# Patient Record
Sex: Female | Born: 1995 | Race: White | Hispanic: No | Marital: Married | Smoking: Never smoker
Health system: Southern US, Community
[De-identification: ages and names within clinical notes are randomized; demographics above are authoritative.]

## PROBLEM LIST (undated history)

## (undated) DIAGNOSIS — K219 Gastro-esophageal reflux disease without esophagitis: Secondary | ICD-10-CM

## (undated) DIAGNOSIS — Z309 Encounter for contraceptive management, unspecified: Secondary | ICD-10-CM

## (undated) HISTORY — PX: NO PAST SURGERIES: SHX2092

## (undated) HISTORY — DX: Gastro-esophageal reflux disease without esophagitis: K21.9

## (undated) HISTORY — DX: Encounter for contraceptive management, unspecified: Z30.9

---

## 2019-02-17 ENCOUNTER — Ambulatory Visit (INDEPENDENT_AMBULATORY_CARE_PROVIDER_SITE_OTHER): Payer: Managed Care, Other (non HMO) | Admitting: Family Medicine

## 2019-02-17 ENCOUNTER — Encounter: Payer: Self-pay | Admitting: Family Medicine

## 2019-02-17 ENCOUNTER — Other Ambulatory Visit: Payer: Self-pay

## 2019-02-17 VITALS — BP 100/43 | HR 72 | Ht 65.0 in | Wt 106.0 lb

## 2019-02-17 DIAGNOSIS — Z3201 Encounter for pregnancy test, result positive: Secondary | ICD-10-CM | POA: Diagnosis not present

## 2019-02-17 DIAGNOSIS — Z124 Encounter for screening for malignant neoplasm of cervix: Secondary | ICD-10-CM

## 2019-02-17 DIAGNOSIS — Z01419 Encounter for gynecological examination (general) (routine) without abnormal findings: Secondary | ICD-10-CM | POA: Diagnosis not present

## 2019-02-17 DIAGNOSIS — N912 Amenorrhea, unspecified: Secondary | ICD-10-CM

## 2019-02-17 DIAGNOSIS — Z113 Encounter for screening for infections with a predominantly sexual mode of transmission: Secondary | ICD-10-CM | POA: Diagnosis not present

## 2019-02-17 LAB — POCT URINE PREGNANCY: Preg Test, Ur: POSITIVE — AB

## 2019-02-17 MED ORDER — ONDANSETRON 4 MG PO TBDP: 4.0000 mg | ORAL_TABLET | Freq: Four times a day (QID) | ORAL | 6 refills | Status: DC | PRN

## 2019-02-17 MED ORDER — FAMOTIDINE 20 MG PO TABS: 20.0000 mg | ORAL_TABLET | Freq: Two times a day (BID) | ORAL | 3 refills | Status: DC

## 2019-02-17 NOTE — Progress Notes (Signed)
Patient has had irregular periods. Has gone 3 months without period. Kathrene Alu RN

## 2019-02-17 NOTE — Progress Notes (Signed)
GYNECOLOGY ANNUAL PREVENTATIVE CARE ENCOUNTER NOTE  Subjective:   Christina Reeves is a 23 y.o. No obstetric history on file. female here for a routine annual gynecologic exam.  Current complaints: Irregular periods. LMP 10/29/2018. Has history of irregular periods and has gone 6 months without periods. Is nauseated and has heartburn.   Denies abnormal vaginal bleeding, discharge, pelvic pain, problems with intercourse or other gynecologic concerns.    Gynecologic History Patient's last menstrual period was 10/29/2018 (exact date). Patient is sexually active  Contraception: none Last Pap: 2017. Results were: normal Last mammogram: n/a.  Obstetric History OB History  No obstetric history on file.    History reviewed. No pertinent past medical history.  History reviewed. No pertinent surgical history.  Current Outpatient Medications on File Prior to Visit  Medication Sig Dispense Refill  . omeprazole (PRILOSEC) 10 MG capsule Take 10 mg by mouth daily.    . ondansetron (ZOFRAN) 4 MG tablet Take by mouth.     No current facility-administered medications on file prior to visit.     Not on File  Social History   Socioeconomic History  . Marital status: Single    Spouse name: Not on file  . Number of children: Not on file  . Years of education: Not on file  . Highest education level: Not on file  Occupational History  . Not on file  Social Needs  . Financial resource strain: Not on file  . Food insecurity    Worry: Not on file    Inability: Not on file  . Transportation needs    Medical: Not on file    Non-medical: Not on file  Tobacco Use  . Smoking status: Never Smoker  . Smokeless tobacco: Never Used  Substance and Sexual Activity  . Alcohol use: Yes    Frequency: Never    Comment: on weekends  . Drug use: Never  . Sexual activity: Yes  Lifestyle  . Physical activity    Days per week: Not on file    Minutes per session: Not on file  . Stress: Not on file   Relationships  . Social Herbalist on phone: Not on file    Gets together: Not on file    Attends religious service: Not on file    Active member of club or organization: Not on file    Attends meetings of clubs or organizations: Not on file    Relationship status: Not on file  . Intimate partner violence    Fear of current or ex partner: Not on file    Emotionally abused: Not on file    Physically abused: Not on file    Forced sexual activity: Not on file  Other Topics Concern  . Not on file  Social History Narrative  . Not on file    Family History  Problem Relation Age of Onset  . Cancer Paternal Grandfather   . Cancer Paternal Grandmother        breast  . Breast cancer Paternal Grandmother   . Hypertension Mother   . Diabetes Brother     The following portions of the patient's history were reviewed and updated as appropriate: allergies, current medications, past family history, past medical history, past social history, past surgical history and problem list.  Review of Systems Pertinent items are noted in HPI.   Objective:  BP (!) 100/43   Pulse 72   Ht 5\' 5"  (1.651 m)   Wt 106  lb (48.1 kg)   LMP 10/29/2018 (Exact Date)   BMI 17.64 kg/m  Wt Readings from Last 3 Encounters:  02/17/19 106 lb (48.1 kg)     CONSTITUTIONAL: Well-developed, well-nourished female in no acute distress.  HENT:  Normocephalic, atraumatic, External right and left ear normal. Oropharynx is clear and moist EYES: Conjunctivae and EOM are normal. Pupils are equal, round, and reactive to light. No scleral icterus.  NECK: Normal range of motion, supple, no masses.  Normal thyroid.   CARDIOVASCULAR: Normal heart rate noted, regular rhythm RESPIRATORY: Clear to auscultation bilaterally. Effort and breath sounds normal, no problems with respiration noted. BREASTS: Symmetric in size. No masses, skin changes, nipple drainage, or lymphadenopathy. ABDOMEN: Soft, normal bowel sounds, no  distention noted.  No tenderness, rebound or guarding.  PELVIC: Normal appearing external genitalia; normal appearing vaginal mucosa and cervix.  No abnormal discharge noted.  Normal uterine size, no other palpable masses, no uterine or adnexal tenderness. MUSCULOSKELETAL: Normal range of motion. No tenderness.  No cyanosis, clubbing, or edema.  2+ distal pulses. SKIN: Skin is warm and dry. No rash noted. Not diaphoretic. No erythema. No pallor. NEUROLOGIC: Alert and oriented to person, place, and time. Normal reflexes, muscle tone coordination. No cranial nerve deficit noted. PSYCHIATRIC: Normal mood and affect. Normal behavior. Normal judgment and thought content.  Assessment:  Annual gynecologic examination with pap smear   Plan:  1. Well Woman Exam Will follow up results of pap smear and manage accordingly. STD testing discussed. Patient requested testing   2. Amenorrhea + pregnancy test. Patient shocked and excited about results. Will get PNL and get patient scheduled for [place] and initial OB appointment. - POCT urine pregnancy  - Cytology - PAP( [facility])  3. Positive pregnancy test - Obstetric Panel, Including HIV - Urine Culture   Routine preventative health maintenance measures emphasized. Please refer to After Visit Summary for other counseling recommendations.    Candelaria Celeste, DO [facility]

## 2019-02-18 LAB — OBSTETRIC PANEL, INCLUDING HIV
Antibody Screen: NEGATIVE
Basophils Absolute: 0 10*3/uL (ref 0.0–0.2)
Basos: 0 %
EOS (ABSOLUTE): 0 10*3/uL (ref 0.0–0.4)
Eos: 0 %
HIV Screen 4th Generation wRfx: NONREACTIVE
Hematocrit: 31.4 % — ABNORMAL LOW (ref 34.0–46.6)
Hemoglobin: 10.8 g/dL — ABNORMAL LOW (ref 11.1–15.9)
Hepatitis B Surface Ag: NEGATIVE
Immature Grans (Abs): 0 10*3/uL (ref 0.0–0.1)
Immature Granulocytes: 0 %
Lymphocytes Absolute: 1.4 10*3/uL (ref 0.7–3.1)
Lymphs: 17 %
MCH: 26.7 pg (ref 26.6–33.0)
MCHC: 34.4 g/dL (ref 31.5–35.7)
MCV: 78 fL — ABNORMAL LOW (ref 79–97)
Monocytes Absolute: 0.4 10*3/uL (ref 0.1–0.9)
Monocytes: 5 %
Neutrophils Absolute: 6.6 10*3/uL (ref 1.4–7.0)
Neutrophils: 78 %
Platelets: 181 10*3/uL (ref 150–450)
RBC: 4.05 x10E6/uL (ref 3.77–5.28)
RDW: 13 % (ref 11.7–15.4)
RPR Ser Ql: NONREACTIVE
Rh Factor: POSITIVE
Rubella Antibodies, IGG: 3.15 index (ref >0.99)
WBC: 8.4 10*3/uL (ref 3.4–10.8)

## 2019-02-19 LAB — URINE CULTURE: Organism ID, Bacteria: NO GROWTH

## 2019-02-23 LAB — CYTOLOGY - PAP
Chlamydia: NEGATIVE
Comment: NEGATIVE
Comment: NORMAL
Diagnosis: NEGATIVE
Neisseria Gonorrhea: NEGATIVE

## 2019-02-24 ENCOUNTER — Inpatient Hospital Stay (HOSPITAL_COMMUNITY)
Admission: AD | Admit: 2019-02-24 | Discharge: 2019-02-24 | Disposition: A | Discharge status: Home / Self Care | Payer: Managed Care, Other (non HMO) | Attending: Obstetrics & Gynecology | Admitting: Obstetrics & Gynecology

## 2019-02-24 ENCOUNTER — Inpatient Hospital Stay (HOSPITAL_COMMUNITY): Payer: Managed Care, Other (non HMO)

## 2019-02-24 ENCOUNTER — Other Ambulatory Visit: Payer: Self-pay

## 2019-02-24 ENCOUNTER — Encounter (HOSPITAL_COMMUNITY): Payer: Self-pay | Admitting: *Deleted

## 2019-02-24 DIAGNOSIS — O26899 Other specified pregnancy related conditions, unspecified trimester: Secondary | ICD-10-CM

## 2019-02-24 DIAGNOSIS — R109 Unspecified abdominal pain: Secondary | ICD-10-CM | POA: Insufficient documentation

## 2019-02-24 DIAGNOSIS — Z3A13 13 weeks gestation of pregnancy: Secondary | ICD-10-CM

## 2019-02-24 DIAGNOSIS — O26891 Other specified pregnancy related conditions, first trimester: Secondary | ICD-10-CM | POA: Diagnosis not present

## 2019-02-24 LAB — URINALYSIS, ROUTINE W REFLEX MICROSCOPIC
Bilirubin Urine: NEGATIVE
Glucose, UA: NEGATIVE mg/dL
Hgb urine dipstick: NEGATIVE
Ketones, ur: NEGATIVE mg/dL
Leukocytes,Ua: NEGATIVE
Nitrite: NEGATIVE
Protein, ur: NEGATIVE mg/dL
Specific Gravity, Urine: 1.002 — ABNORMAL LOW (ref 1.005–1.030)
pH: 7 (ref 5.0–8.0)

## 2019-02-24 LAB — CBC
HCT: 32.5 % — ABNORMAL LOW (ref 36.0–46.0)
Hemoglobin: 11.3 g/dL — ABNORMAL LOW (ref 12.0–15.0)
MCH: 26.3 pg (ref 26.0–34.0)
MCHC: 34.8 g/dL (ref 30.0–36.0)
MCV: 75.6 fL — ABNORMAL LOW (ref 80.0–100.0)
Platelets: 163 10*3/uL (ref 150–400)
RBC: 4.3 MIL/uL (ref 3.87–5.11)
RDW: 12.7 % (ref 11.5–15.5)
WBC: 7.6 10*3/uL (ref 4.0–10.5)
nRBC: 0 % (ref 0.0–0.2)

## 2019-02-24 LAB — HCG, QUANTITATIVE, PREGNANCY: hCG, Beta Chain, Quant, S: 57791 m[IU]/mL — ABNORMAL HIGH (ref <5)

## 2019-02-24 NOTE — Discharge Instructions (Signed)
Second Trimester of Pregnancy  The second trimester is from week 14 through week 27 (month 4 through 6). This is often the time in pregnancy that you feel your best. Often times, morning sickness has lessened or quit. You may have more energy, and you may get hungry more often. Your unborn baby is growing rapidly. At the end of the sixth month, he or she is about 9 inches long and weighs about 1 pounds. You will likely feel the baby move between 18 and 20 weeks of pregnancy. Follow these instructions at home: Medicines  Take over-the-counter and prescription medicines only as told by your doctor. Some medicines are safe and some medicines are not safe during pregnancy.  Take a prenatal vitamin that contains at least 600 micrograms (mcg) of folic acid.  If you have trouble pooping (constipation), take medicine that will make your stool soft (stool softener) if your doctor approves. Eating and drinking   Eat regular, healthy meals.  Avoid raw meat and uncooked cheese.  If you get low calcium from the food you eat, talk to your doctor about taking a daily calcium supplement.  Avoid foods that are high in fat and sugars, such as fried and sweet foods.  If you feel sick to your stomach (nauseous) or throw up (vomit): ? Eat 4 or 5 small meals a day instead of 3 large meals. ? Try eating a few soda crackers. ? Drink liquids between meals instead of during meals.  To prevent constipation: ? Eat foods that are high in fiber, like fresh fruits and vegetables, whole grains, and beans. ? Drink enough fluids to keep your pee (urine) clear or pale yellow. Activity  Exercise only as told by your doctor. Stop exercising if you start to have cramps.  Do not exercise if it is too hot, too humid, or if you are in a place of great height (high altitude).  Avoid heavy lifting.  Wear low-heeled shoes. Sit and stand up straight.  You can continue to have sex unless your doctor tells you not  to. Relieving pain and discomfort  Wear a good support bra if your breasts are tender.  Take warm water baths (sitz baths) to soothe pain or discomfort caused by hemorrhoids. Use hemorrhoid cream if your doctor approves.  Rest with your legs raised if you have leg cramps or low back pain.  If you develop puffy, bulging veins (varicose veins) in your legs: ? Wear support hose or compression stockings as told by your doctor. ? Raise (elevate) your feet for 15 minutes, 3-4 times a day. ? Limit salt in your food. Prenatal care  Write down your questions. Take them to your prenatal visits.  Keep all your prenatal visits as told by your doctor. This is important. Safety  Wear your seat belt when driving.  Make a list of emergency phone numbers, including numbers for family, friends, the hospital, and police and fire departments. General instructions  Ask your doctor about the right foods to eat or for help finding a counselor, if you need these services.  Ask your doctor about local prenatal classes. Begin classes before month 6 of your pregnancy.  Do not use hot tubs, steam rooms, or saunas.  Do not douche or use tampons or scented sanitary pads.  Do not cross your legs for long periods of time.  Visit your dentist if you have not done so. Use a soft toothbrush to brush your teeth. Floss gently.  Avoid all smoking, herbs,   and alcohol. Avoid drugs that are not approved by your doctor.  Do not use any products that contain nicotine or tobacco, such as cigarettes and e-cigarettes. If you need help quitting, ask your doctor.  Avoid cat litter boxes and soil used by cats. These carry germs that can cause birth defects in the baby and can cause a loss of your baby (miscarriage) or stillbirth. Contact a doctor if:  You have mild cramps or pressure in your lower belly.  You have pain when you pee (urinate).  You have bad smelling fluid coming from your vagina.  You continue to  feel sick to your stomach (nauseous), throw up (vomit), or have watery poop (diarrhea).  You have a nagging pain in your belly area.  You feel dizzy. Get help right away if:  You have a fever.  You are leaking fluid from your vagina.  You have spotting or bleeding from your vagina.  You have severe belly cramping or pain.  You lose or gain weight rapidly.  You have trouble catching your breath and have chest pain.  You notice sudden or extreme puffiness (swelling) of your face, hands, ankles, feet, or legs.  You have not felt the baby move in over an hour.  You have severe headaches that do not go away when you take medicine.  You have trouble seeing. Summary  The second trimester is from week 14 through week 27 (months 4 through 6). This is often the time in pregnancy that you feel your best.  To take care of yourself and your unborn baby, you will need to eat healthy meals, take medicines only if your doctor tells you to do so, and do activities that are safe for you and your baby.  Call your doctor if you get sick or if you notice anything unusual about your pregnancy. Also, call your doctor if you need help with the right food to eat, or if you want to know what activities are safe for you. This information is not intended to replace advice given to you by your health care provider. Make sure you discuss any questions you have with your health care provider. Document Released: 07/02/2009 Document Revised: 07/30/2018 Document Reviewed: 05/13/2016 Elsevier Patient Education  2020 [employer]  Safe Medications in Pregnancy   Acne: Benzoyl Peroxide Salicylic Acid  Backache/Headache: Tylenol: 2 regular strength every 4 hours OR              2 Extra strength every 6 hours  Colds/Coughs/Allergies: Benadryl (alcohol free) 25 mg every 6 hours as needed Breath right strips Claritin Cepacol throat lozenges Chloraseptic throat spray Cold-Eeze- up to three times per  day Cough drops, alcohol free Flonase (by prescription only) Guaifenesin Mucinex Robitussin DM (plain only, alcohol free) Saline nasal spray/drops Sudafed (pseudoephedrine) & Actifed ** use only after [redacted] weeks gestation and if you do not have high blood pressure Tylenol Vicks Vaporub Zinc lozenges Zyrtec   Constipation: Colace Ducolax suppositories Fleet enema Glycerin suppositories Metamucil Milk of magnesia Miralax Senokot Smooth move tea  Diarrhea: Kaopectate Imodium A-D  *NO pepto Bismol  Hemorrhoids: Anusol Anusol HC Preparation H Tucks  Indigestion: Tums Maalox Mylanta Zantac  Pepcid  Insomnia: Benadryl (alcohol free) 25mg  every 6 hours as needed Tylenol PM Unisom, no Gelcaps  Leg Cramps: Tums MagGel  Nausea/Vomiting:  Bonine Dramamine Emetrol Ginger extract Sea bands Meclizine  Nausea medication to take during pregnancy:  Unisom (doxylamine succinate 25 mg tablets) Take one tablet daily at bedtime.  If symptoms are not adequately controlled, the dose can be increased to a maximum recommended dose of two tablets daily (1/2 tablet in the morning, 1/2 tablet mid-afternoon and one at bedtime). Vitamin B6 100mg  tablets. Take one tablet twice a day (up to 200 mg per day).  Skin Rashes: Aveeno products Benadryl cream or 25mg  every 6 hours as needed Calamine Lotion 1% cortisone cream  Yeast infection: Gyne-lotrimin 7 Monistat 7   **If taking multiple medications, please check labels to avoid duplicating the same active ingredients **take medication as directed on the label ** Do not exceed 4000 mg of tylenol in 24 hours **Do not take medications that contain aspirin or ibuprofen

## 2019-02-24 NOTE — MAU Note (Signed)
.   Christina Reeves is a 23 y.o. at [redacted]w[redacted]d here in MAU reporting: lower abdominal cramping that started today with vaginal bleeding when she wipes Pt states she has irregular cycles and did not know she was pregnant until last Thursday.  LMP: 11/29/18 Onset of complaint: yesterday  Pain score: 9 Vitals:   02/24/19 1125  BP: 117/66  Pulse: 78  Resp: 16  Temp: 98.2 F (36.8 C)  SpO2: 100%     FHT:156 Lab orders placed from triage: UA

## 2019-02-24 NOTE — MAU Provider Note (Addendum)
Chief Complaint: Abdominal cramping and vaginal bleeding   First Provider Initiated Contact with Patient 02/24/19 1149      SUBJECTIVE  HPI: Christina Reeves is a 23 y.o. G1P0 at 7165w3d by LMP who presents to maternity admissions reporting abdominal cramping and vaginal bleeding. The patient states that she found out she was pregnant last week. This morning she began to experience abdominal cramps that felt like menstrual cramps.  She rated the pain severity at 9/10. She also noticed light pink blood when she wiped after urinating.  The patient took 500 MG acetaminophen before traveling to the MAU and her pain is currently 2/10. She denies vaginal itching/burning, urinary symptoms, h/a, dizziness, n/v, or fever/chills.      History reviewed. No pertinent past medical history. History reviewed. No pertinent surgical history. Social History   Socioeconomic History  . Marital status: Single    Spouse name: Not on file  . Number of children: Not on file  . Years of education: Not on file  . Highest education level: Not on file  Occupational History  . Not on file  Social Needs  . Financial resource strain: Not on file  . Food insecurity    Worry: Not on file    Inability: Not on file  . Transportation needs    Medical: Not on file    Non-medical: Not on file  Tobacco Use  . Smoking status: Never Smoker  . Smokeless tobacco: Never Used  Substance and Sexual Activity  . Alcohol use: Yes    Frequency: Never    Comment: on weekends  . Drug use: Never  . Sexual activity: Yes  Lifestyle  . Physical activity    Days per week: Not on file    Minutes per session: Not on file  . Stress: Not on file  Relationships  . Social Musicianconnections    Talks on phone: Not on file    Gets together: Not on file    Attends religious service: Not on file    Active member of club or organization: Not on file    Attends meetings of clubs or organizations: Not on file    Relationship status: Not on file   . Intimate partner violence    Fear of current or ex partner: Not on file    Emotionally abused: Not on file    Physically abused: Not on file    Forced sexual activity: Not on file  Other Topics Concern  . Not on file  Social History Narrative  . Not on file   No current facility-administered medications on file prior to encounter.    Current Outpatient Medications on File Prior to Encounter  Medication Sig Dispense Refill  . famotidine (PEPCID) 20 MG tablet Take 1 tablet (20 mg total) by mouth 2 (two) times daily. 60 tablet 3  . ondansetron (ZOFRAN ODT) 4 MG disintegrating tablet Take 1 tablet (4 mg total) by mouth every 6 (six) hours as needed for nausea. 30 tablet 6  . omeprazole (PRILOSEC) 10 MG capsule Take 10 mg by mouth daily.    . ondansetron (ZOFRAN) 4 MG tablet Take by mouth.     No Known Allergies  ROS:  Review of Systems  Constitutional: Negative for fever.  Respiratory: Negative for shortness of breath.   Cardiovascular: Negative for chest pain.  Gastrointestinal: Positive for abdominal pain (Cramping). Negative for nausea and vomiting.  Genitourinary: Positive for vaginal bleeding (spotting). Negative for dysuria and vaginal discharge.    I  have reviewed patient's Past Medical Hx, Surgical Hx, Family Hx, Social Hx, medications and allergies.   Physical Exam   Patient Vitals for the past 24 hrs:  BP Temp Temp src Pulse Resp SpO2 Weight  02/24/19 1339 110/76 98.5 F (36.9 C) Oral 72 16 - -  02/24/19 1149 - - - - - - 105 lb (47.6 kg)  02/24/19 1125 117/66 98.2 F (36.8 C) - 78 16 100 % -   Constitutional: Well-developed, well-nourished female in no acute distress.  Cardiovascular: normal rate Respiratory: normal effort GI: Abd soft, non-tender. Pos BS x 4 MS: Extremities nontender, no edema, normal ROM Neurologic: Alert and oriented x 4.  GU: Neg CVAT.  FHT 156 by doppler  LAB RESULTS Results for orders placed or performed during the hospital  encounter of 02/24/19 (from the past 24 hour(s))  Urinalysis, Routine w reflex microscopic     Status: Abnormal   Collection Time: 02/24/19 11:25 AM  Result Value Ref Range   Color, Urine COLORLESS (A) YELLOW   APPearance CLEAR CLEAR   Specific Gravity, Urine 1.002 (L) 1.005 - 1.030   pH 7.0 5.0 - 8.0   Glucose, UA NEGATIVE NEGATIVE mg/dL   Hgb urine dipstick NEGATIVE NEGATIVE   Bilirubin Urine NEGATIVE NEGATIVE   Ketones, ur NEGATIVE NEGATIVE mg/dL   Protein, ur NEGATIVE NEGATIVE mg/dL   Nitrite NEGATIVE NEGATIVE   Leukocytes,Ua NEGATIVE NEGATIVE  CBC     Status: Abnormal   Collection Time: 02/24/19 12:16 PM  Result Value Ref Range   WBC 7.6 4.0 - 10.5 K/uL   RBC 4.30 3.87 - 5.11 MIL/uL   Hemoglobin 11.3 (L) 12.0 - 15.0 g/dL   HCT 30.8 (L) 65.7 - 84.6 %   MCV 75.6 (L) 80.0 - 100.0 fL   MCH 26.3 26.0 - 34.0 pg   MCHC 34.8 30.0 - 36.0 g/dL   RDW 96.2 95.2 - 84.1 %   Platelets 163 150 - 400 K/uL   nRBC 0.0 0.0 - 0.2 %  hCG, quantitative, pregnancy     Status: Abnormal   Collection Time: 02/24/19 12:16 PM  Result Value Ref Range   hCG, Beta Chain, Quant, S 57,791 (H) <5 mIU/mL    O/Positive/-- (10/29 1401)  IMAGING US Ob Comp Less 14 Wks  Result Date: 02/24/2019 CLINICAL DATA:  Cramping EXAM: OBSTETRIC <14 WK ULTRASOUND TECHNIQUE: Transabdominal ultrasound was performed for evaluation of the gestation as well as the maternal uterus and adnexal regions. COMPARISON:  None. FINDINGS: Intrauterine gestational sac: Single Yolk sac:  Not Visualized. Embryo:  Visualized. Cardiac Activity: Visualized. Heart Rate: 150 bpm CRL:   68.65 mm   13 w 1 d                  [place] EDC: 08/31/2019 Subchorionic hemorrhage:  None visualized. Maternal uterus/adnexae: Unremarkable.  No free fluid. IMPRESSION: Viable intrauterine pregnancy as described. Electronically Signed   By: Guadlupe Spanish M.D.   On: 02/24/2019 13:02    MAU Management/MDM: Orders Placed This Encounter  Procedures  . US OB Comp  Less 14 Wks  . Urinalysis, Routine w reflex microscopic  . CBC  . hCG, quantitative, pregnancy  . Discharge patient      No orders of the defined types were placed in this encounter.  Ultrasound and hCG were ordered to verify and date pregnancy. CBC ordered to rule out infection and anemia Urinalysis ordered to rule out UTI Ultrasound shows intrauterine pregnancy.  HR 150bpm  CRL: 68.61mm  [redacted]w[redacted]d Urinalysis unremarkable CBC unremarkable    ASSESSMENT 1. [redacted] weeks gestation of pregnancy   2. Abdominal cramping affecting pregnancy     PLAN Discharge home with patient education on second trimester of pregnancy. Patient instructed to contact the clinic in order to begin prenatal care.  Strict return precautions given to the patient which include vaginal bleeding and loss of fluid.   Dorothyann Peng 02/24/2019  1:34 PM  I confirm that I have verified the information documented in the physician assistant student's note and that I have also personally reperformed the history, physical exam and all medical decision making activities of this service and have verified that all service and findings are accurately documented in this student's note.   Mallie Snooks, CNM 02/24/2019 1:51 PM

## 2019-03-01 MED ORDER — PREPLUS 27-1 MG PO TABS: 1.0000 | ORAL_TABLET | Freq: Every day | ORAL | 3 refills | Status: AC

## 2019-03-01 NOTE — Telephone Encounter (Signed)
Patient had brief contact with COVID positive coworker. She has an initial OB appointment scheduled for 11/12. Can you move this back 2 weeks? Thanks!

## 2019-03-03 ENCOUNTER — Encounter: Payer: Self-pay | Admitting: Family Medicine

## 2019-03-15 ENCOUNTER — Ambulatory Visit (INDEPENDENT_AMBULATORY_CARE_PROVIDER_SITE_OTHER): Payer: Managed Care, Other (non HMO) | Admitting: Nurse Practitioner

## 2019-03-15 ENCOUNTER — Other Ambulatory Visit: Payer: Self-pay

## 2019-03-15 ENCOUNTER — Encounter: Payer: Self-pay | Admitting: Nurse Practitioner

## 2019-03-15 VITALS — BP 104/66 | HR 77 | Wt 106.0 lb

## 2019-03-15 DIAGNOSIS — Z124 Encounter for screening for malignant neoplasm of cervix: Secondary | ICD-10-CM | POA: Diagnosis not present

## 2019-03-15 DIAGNOSIS — Z3A15 15 weeks gestation of pregnancy: Secondary | ICD-10-CM

## 2019-03-15 DIAGNOSIS — R636 Underweight: Secondary | ICD-10-CM

## 2019-03-15 DIAGNOSIS — Z23 Encounter for immunization: Secondary | ICD-10-CM | POA: Diagnosis not present

## 2019-03-15 DIAGNOSIS — O26892 Other specified pregnancy related conditions, second trimester: Secondary | ICD-10-CM

## 2019-03-15 DIAGNOSIS — Z3400 Encounter for supervision of normal first pregnancy, unspecified trimester: Secondary | ICD-10-CM | POA: Insufficient documentation

## 2019-03-15 DIAGNOSIS — E739 Lactose intolerance, unspecified: Secondary | ICD-10-CM | POA: Insufficient documentation

## 2019-03-15 LAB — OB RESULTS CONSOLE GC/CHLAMYDIA: Gonorrhea: NEGATIVE

## 2019-03-15 MED ORDER — DOCUSATE CALCIUM 240 MG PO CAPS: 240.0000 mg | ORAL_CAPSULE | Freq: Every day | ORAL | 2 refills | Status: DC

## 2019-03-15 NOTE — Progress Notes (Signed)
Subjective:   Christina Reeves is a 23 y.o. G1P0 at [redacted]w[redacted]d by early ultrasound being seen today for her first obstetrical visit.  Her obstetrical history is significant for underweight. Patient does intend to breast feed. Pregnancy history fully reviewed.  Patient reports some nausea and constipation that is likely due to taking Zofran for nausea.  HISTORY: OB History  Gravida Para Term Preterm AB Living  1 0 0 0 0 0  SAB TAB Ectopic Multiple Live Births  0 0 0 0 0    # Outcome Date GA Lbr Len/2nd Weight Sex Delivery Anes PTL Lv  1 Current            History reviewed. No pertinent past medical history. History reviewed. No pertinent surgical history. Family History  Problem Relation Age of Onset  . Cancer Paternal Grandfather   . Cancer Paternal Grandmother        breast  . Breast cancer Paternal Grandmother   . Hypertension Mother   . Diabetes Brother    Social History   Tobacco Use  . Smoking status: Never Smoker  . Smokeless tobacco: Never Used  Substance Use Topics  . Alcohol use: Yes    Frequency: Never    Comment: on weekends  . Drug use: Never   No Known Allergies Current Outpatient Medications on File Prior to Visit  Medication Sig Dispense Refill  . famotidine (PEPCID) 20 MG tablet Take 1 tablet (20 mg total) by mouth 2 (two) times daily. 60 tablet 3  . ondansetron (ZOFRAN ODT) 4 MG disintegrating tablet Take 1 tablet (4 mg total) by mouth every 6 (six) hours as needed for nausea. 30 tablet 6  . Prenatal Vit-Fe Fumarate-FA (PREPLUS) 27-1 MG TABS Take 1 tablet by mouth daily. 90 tablet 3   No current facility-administered medications on file prior to visit.      Exam   Vitals:   03/15/19 0923  BP: 104/66  Pulse: 77  Weight: 106 lb (48.1 kg)   Fetal Heart Rate (bpm): 150  Uterus:   12 week size - above pubic symphysis  Pelvic Exam: Perineum: Not examined   Vulva:    Vagina:     Cervix:    Adnexa:    Bony Pelvis:   System: General:  well-developed, well-nourished female in no acute distress   Breast:  deferred   Skin: normal coloration and turgor, no rashes   Neurologic: oriented, normal, negative, normal mood   Extremities: normal strength, tone, and muscle mass, ROM of all joints is normal   HEENT extraocular movement intact and sclera clear, anicteric   Mouth/Teeth Reports no problems  - brushes  3x daily, flosses daily   Neck supple and no masses, normal thyroid   Cardiovascular: regular rate and rhythm   Respiratory:  no respiratory distress, normal breath sounds   Abdomen: soft, non-tender; no masses,  no organomegaly     Assessment:   Pregnancy: G1P0 Patient Active Problem List   Diagnosis Date Noted  . Supervision of normal first pregnancy, antepartum 03/15/2019     Plan:  1. Supervision of normal first pregnancy, antepartum Wearing masks at work.  Had a possible Covid exposure as a coworker tested positive but she has not been ill and her covid testing was negative. Taking Zofran as needed for nausea and does have some constipation Prescribed docusate calcium Discussed Babyscripts and Mychart apps Advised childbirth and breastfeeding online classes later in pregnancy Did not discuss weight gain today since  she is still having problems with nausea.  Based on BMI of 17 will need to gain 28-40 pounds in pregnancy.  Lactose intolerance Discussed sources of protein that do not contain lactose  - Genetic Screening - [facility] AMB BABYSCRIPTS SCHEDULE OPTIMIZATION - GC/Chlamydia probe amp ([place])not at [facility] - [place] MFM OB COMP + 14 WK; Future - docusate calcium (SURFAK) 240 MG capsule; Take 1 capsule (240 mg total) by mouth daily.  Dispense: 30 capsule; Refill: 2 - AFP, Serum, Open Spina Bifida   Initial labs drawn. Continue prenatal vitamins. Genetic Screening discussed, Panorama and AFP done : ordered. Ultrasound discussed; fetal anatomic survey: order at next visit. Problem list reviewed and  updated. The nature of [facility] with multiple MDs and other Advanced Practice Providers was explained to patient; also emphasized that residents, students are part of our team. Routine obstetric precautions reviewed. Return in about 4 weeks (around 04/12/2019) for virtual ROB.  Total face-to-face time with patient: 40 minutes.  Over 50% of encounter was spent on counseling and coordination of care.     Nolene Bernheim, FNP Family Nurse Practitioner, [facility] for [employer], [facility] 03/15/2019 10:11 AM

## 2019-03-15 NOTE — Patient Instructions (Addendum)
Glycerine suppositories   Second Trimester of Pregnancy  The second trimester is from week 14 through week 27 (month 4 through 6). This is often the time in pregnancy that you feel your best. Often times, morning sickness has lessened or quit. You may have more energy, and you may get hungry more often. Your unborn baby is growing rapidly. At the end of the sixth month, he or she is about 9 inches long and weighs about 1 pounds. You will likely feel the baby move between 18 and 20 weeks of pregnancy. Follow these instructions at home: Medicines  Take over-the-counter and prescription medicines only as told by your doctor. Some medicines are safe and some medicines are not safe during pregnancy.  Take a prenatal vitamin that contains at least 600 micrograms (mcg) of folic acid.  If you have trouble pooping (constipation), take medicine that will make your stool soft (stool softener) if your doctor approves. Eating and drinking   Eat regular, healthy meals.  Avoid raw meat and uncooked cheese.  If you get low calcium from the food you eat, talk to your doctor about taking a daily calcium supplement.  Avoid foods that are high in fat and sugars, such as fried and sweet foods.  If you feel sick to your stomach (nauseous) or throw up (vomit): ? Eat 4 or 5 small meals a day instead of 3 large meals. ? Try eating a few soda crackers. ? Drink liquids between meals instead of during meals.  To prevent constipation: ? Eat foods that are high in fiber, like fresh fruits and vegetables, whole grains, and beans. ? Drink enough fluids to keep your pee (urine) clear or pale yellow. Activity  Exercise only as told by your doctor. Stop exercising if you start to have cramps.  Do not exercise if it is too hot, too humid, or if you are in a place of great height (high altitude).  Avoid heavy lifting.  Wear low-heeled shoes. Sit and stand up straight.  You can continue to have sex unless your  doctor tells you not to. Relieving pain and discomfort  Wear a good support bra if your breasts are tender.  Take warm water baths (sitz baths) to soothe pain or discomfort caused by hemorrhoids. Use hemorrhoid cream if your doctor approves.  Rest with your legs raised if you have leg cramps or low back pain.  If you develop puffy, bulging veins (varicose veins) in your legs: ? Wear support hose or compression stockings as told by your doctor. ? Raise (elevate) your feet for 15 minutes, 3-4 times a day. ? Limit salt in your food. Prenatal care  Write down your questions. Take them to your prenatal visits.  Keep all your prenatal visits as told by your doctor. This is important. Safety  Wear your seat belt when driving.  Make a list of emergency phone numbers, including numbers for family, friends, the hospital, and police and fire departments. General instructions  Ask your doctor about the right foods to eat or for help finding a counselor, if you need these services.  Ask your doctor about local prenatal classes. Begin classes before month 6 of your pregnancy.  Do not use hot tubs, steam rooms, or saunas.  Do not douche or use tampons or scented sanitary pads.  Do not cross your legs for long periods of time.  Visit your dentist if you have not done so. Use a soft toothbrush to brush your teeth. Floss gently.  Avoid all smoking, herbs, and alcohol. Avoid drugs that are not approved by your doctor.  Do not use any products that contain nicotine or tobacco, such as cigarettes and e-cigarettes. If you need help quitting, ask your doctor.  Avoid cat litter boxes and soil used by cats. These carry germs that can cause birth defects in the baby and can cause a loss of your baby (miscarriage) or stillbirth. Contact a doctor if:  You have mild cramps or pressure in your lower belly.  You have pain when you pee (urinate).  You have bad smelling fluid coming from your vagina.   You continue to feel sick to your stomach (nauseous), throw up (vomit), or have watery poop (diarrhea).  You have a nagging pain in your belly area.  You feel dizzy. Get help right away if:  You have a fever.  You are leaking fluid from your vagina.  You have spotting or bleeding from your vagina.  You have severe belly cramping or pain.  You lose or gain weight rapidly.  You have trouble catching your breath and have chest pain.  You notice sudden or extreme puffiness (swelling) of your face, hands, ankles, feet, or legs.  You have not felt the baby move in over an hour.  You have severe headaches that do not go away when you take medicine.  You have trouble seeing. Summary  The second trimester is from week 14 through week 27 (months 4 through 6). This is often the time in pregnancy that you feel your best.  To take care of yourself and your unborn baby, you will need to eat healthy meals, take medicines only if your doctor tells you to do so, and do activities that are safe for you and your baby.  Call your doctor if you get sick or if you notice anything unusual about your pregnancy. Also, call your doctor if you need help with the right food to eat, or if you want to know what activities are safe for you. This information is not intended to replace advice given to you by your health care provider. Make sure you discuss any questions you have with your health care provider. Document Released: 07/02/2009 Document Revised: 07/30/2018 Document Reviewed: 05/13/2016 Elsevier Patient Education  2020 [employer].

## 2019-03-16 ENCOUNTER — Other Ambulatory Visit: Payer: Self-pay

## 2019-03-16 DIAGNOSIS — Z3400 Encounter for supervision of normal first pregnancy, unspecified trimester: Secondary | ICD-10-CM

## 2019-03-16 LAB — GC/CHLAMYDIA PROBE AMP (~~LOC~~) NOT AT ARMC
Chlamydia: NEGATIVE
Comment: NEGATIVE
Comment: NORMAL
Neisseria Gonorrhea: NEGATIVE

## 2019-03-16 MED ORDER — AMBULATORY NON FORMULARY MEDICATION: 1.0000 | 0 refills | Status: DC

## 2019-03-17 LAB — AFP, SERUM, OPEN SPINA BIFIDA
AFP MoM: 1.69
AFP Value: 67.5 ng/mL
Gest. Age on Collection Date: 15.6 weeks
Maternal Age At EDD: 24.8 yr
OSBR Risk 1 IN: 1671
Test Results:: NEGATIVE
Weight: 106 [lb_av]

## 2019-04-13 ENCOUNTER — Encounter: Payer: Self-pay | Admitting: Obstetrics & Gynecology

## 2019-04-13 ENCOUNTER — Encounter: Payer: Self-pay | Admitting: Nurse Practitioner

## 2019-04-13 ENCOUNTER — Ambulatory Visit (HOSPITAL_COMMUNITY): Payer: Managed Care, Other (non HMO)

## 2019-04-13 ENCOUNTER — Ambulatory Visit (HOSPITAL_COMMUNITY)
Admission: RE | Admit: 2019-04-13 | Discharge: 2019-04-13 | Disposition: A | Discharge status: Home / Self Care | Payer: Managed Care, Other (non HMO) | Source: Ambulatory Visit | Attending: Obstetrics and Gynecology | Admitting: Obstetrics and Gynecology

## 2019-04-13 ENCOUNTER — Ambulatory Visit (INDEPENDENT_AMBULATORY_CARE_PROVIDER_SITE_OTHER): Payer: Managed Care, Other (non HMO) | Admitting: Obstetrics & Gynecology

## 2019-04-13 ENCOUNTER — Other Ambulatory Visit (HOSPITAL_COMMUNITY): Payer: Self-pay | Admitting: *Deleted

## 2019-04-13 ENCOUNTER — Other Ambulatory Visit: Payer: Self-pay

## 2019-04-13 VITALS — BP 100/61 | HR 77 | Wt 112.0 lb

## 2019-04-13 DIAGNOSIS — Z3A20 20 weeks gestation of pregnancy: Secondary | ICD-10-CM

## 2019-04-13 DIAGNOSIS — Z362 Encounter for other antenatal screening follow-up: Secondary | ICD-10-CM

## 2019-04-13 DIAGNOSIS — O26892 Other specified pregnancy related conditions, second trimester: Secondary | ICD-10-CM

## 2019-04-13 DIAGNOSIS — R636 Underweight: Secondary | ICD-10-CM

## 2019-04-13 DIAGNOSIS — Z148 Genetic carrier of other disease: Secondary | ICD-10-CM | POA: Insufficient documentation

## 2019-04-13 DIAGNOSIS — Z363 Encounter for antenatal screening for malformations: Secondary | ICD-10-CM | POA: Diagnosis not present

## 2019-04-13 DIAGNOSIS — Z3400 Encounter for supervision of normal first pregnancy, unspecified trimester: Secondary | ICD-10-CM | POA: Diagnosis not present

## 2019-04-13 NOTE — Patient Instructions (Signed)

## 2019-04-13 NOTE — Progress Notes (Signed)
   PRENATAL VISIT NOTE  Subjective:  Christina Reeves is a 23 y.o. G1P0 at [redacted]w[redacted]d being seen today for ongoing prenatal care.  She is currently monitored for the following issues for this low-risk pregnancy and has Supervision of normal first pregnancy, antepartum; Patient underweight; and Lactose intolerance on their problem list.  Patient reports no complaints.  Contractions: Not present. Vag. Bleeding: None.  Movement: Present. Denies leaking of fluid.   The following portions of the patient's history were reviewed and updated as appropriate: allergies, current medications, past family history, past medical history, past social history, past surgical history and problem list.   Objective:   Vitals:   04/13/19 1453  BP: 100/61  Pulse: 77  Weight: 112 lb (50.8 kg)    Fetal Status: Fetal Heart Rate (bpm): 130   Movement: Present     General:  Alert, oriented and cooperative. Patient is in no acute distress.  Skin: Skin is warm and dry. No rash noted.   Cardiovascular: Normal heart rate noted  Respiratory: Normal respiratory effort, no problems with respiration noted  Abdomen: Soft, gravid, appropriate for gestational age.  Pain/Pressure: Absent     Pelvic: Cervical exam deferred        Extremities: Normal range of motion.  Edema: None  Mental Status: Normal mood and affect. Normal behavior. Normal judgment and thought content.   Assessment and Plan:  Pregnancy: G1P0 at [redacted]w[redacted]d 1. Supervision of normal first pregnancy, antepartum FH WNL [place] today that was WNL.    2. Patient underweight   Preterm labor symptoms and general obstetric precautions including but not limited to vaginal bleeding, contractions, leaking of fluid and fetal movement were reviewed in detail with the patient. Please refer to After Visit Summary for other counseling recommendations.   Return in about 4 weeks (around 05/11/2019) for Web based.  Future Appointments  Date Time Provider [place]    05/11/2019  7:45 AM WH-MFC [place] 5 WH-MFCUS MFC-US    Lavonia Drafts, MD

## 2019-04-22 NOTE — L&D Delivery Note (Signed)
OB/[facility]N [facility] Delivery Note  Christina Reeves is a 24 y.o. G1P0 s/p vag del after elective IOL at [redacted]w[redacted]d.    ROM: 0h 67m with clear fluid GBS Status: neg Maximum Maternal Temperature: 98.3  Labor Progress: Marland Kitchen Ms [name] is a G1 who was admitted for elective IOL at term this morning. She had a cervical foley & cytotec followed by Pitocin and AROM before progressing to vag del after pushing for only 10 mins.  Delivery Date/Time: May 12th, 2021 at 1907 Delivery: Called to room and patient was complete and pushing. Head delivered ROA. No nuchal cord present. Shoulder and body delivered in usual fashion. Infant with spontaneous cry, placed on mother's abdomen, dried and stimulated. Cord clamped x 2 after 1-minute delay, and cut by FOB. Cord blood drawn. Placenta delivered spontaneously with gentle cord traction. Fundus firm with massage and Pitocin. Labia, perineum, vagina, and cervix inspected and shown to have bilat labial abrasions, hemostatic and not repaired.   Placenta: spont, intact Complications: none Lacerations: none EBL: 282cc Analgesia: epidural  Postpartum Planning [x]  message to sent to schedule follow-up   Infant: female  APGARs 8/9  2724gm (6lb 0.1oz)  10/9, CNM  08/31/2019 8:33 PM

## 2019-05-05 ENCOUNTER — Telehealth: Payer: Self-pay

## 2019-05-05 NOTE — Telephone Encounter (Signed)
Pt called the office stating she received a call from [facility] pharmacy stating her BP cuff is not covered by her insurance. Pt made aware that we will provide a BP cuff for her at her next appt. Understanding was voiced.  Hillari Zumwalt l Orestes Geiman, CMA

## 2019-05-11 ENCOUNTER — Other Ambulatory Visit: Payer: Self-pay

## 2019-05-11 ENCOUNTER — Encounter (HOSPITAL_COMMUNITY): Payer: Self-pay

## 2019-05-11 ENCOUNTER — Ambulatory Visit (HOSPITAL_COMMUNITY): Payer: Managed Care, Other (non HMO) | Admitting: *Deleted

## 2019-05-11 ENCOUNTER — Ambulatory Visit (HOSPITAL_COMMUNITY)
Admission: RE | Admit: 2019-05-11 | Discharge: 2019-05-11 | Disposition: A | Discharge status: Home / Self Care | Payer: Managed Care, Other (non HMO) | Source: Ambulatory Visit | Attending: Obstetrics and Gynecology | Admitting: Obstetrics and Gynecology

## 2019-05-11 DIAGNOSIS — Z362 Encounter for other antenatal screening follow-up: Secondary | ICD-10-CM | POA: Diagnosis not present

## 2019-05-11 DIAGNOSIS — Z3400 Encounter for supervision of normal first pregnancy, unspecified trimester: Secondary | ICD-10-CM | POA: Insufficient documentation

## 2019-05-11 DIAGNOSIS — Z3A24 24 weeks gestation of pregnancy: Secondary | ICD-10-CM | POA: Diagnosis not present

## 2019-05-27 ENCOUNTER — Ambulatory Visit (INDEPENDENT_AMBULATORY_CARE_PROVIDER_SITE_OTHER): Payer: Managed Care, Other (non HMO) | Admitting: Family Medicine

## 2019-05-27 ENCOUNTER — Encounter: Payer: Self-pay | Admitting: Family Medicine

## 2019-05-27 DIAGNOSIS — Z3402 Encounter for supervision of normal first pregnancy, second trimester: Secondary | ICD-10-CM

## 2019-05-27 DIAGNOSIS — Z23 Encounter for immunization: Secondary | ICD-10-CM | POA: Diagnosis not present

## 2019-05-27 DIAGNOSIS — Z3400 Encounter for supervision of normal first pregnancy, unspecified trimester: Secondary | ICD-10-CM

## 2019-05-27 DIAGNOSIS — Z3A26 26 weeks gestation of pregnancy: Secondary | ICD-10-CM

## 2019-05-27 NOTE — Progress Notes (Signed)
   PRENATAL VISIT NOTE  Subjective:  Christina Reeves is a 24 y.o. G1P0 at [redacted]w[redacted]d being seen today for ongoing prenatal care.  She is currently monitored for the following issues for this low-risk pregnancy and has Supervision of normal first pregnancy, antepartum; Patient underweight; Lactose intolerance; and Genetic carrier status on their problem list.  Patient reports no complaints.  Contractions: Not present. Vag. Bleeding: None.  Movement: Present. Denies leaking of fluid.   The following portions of the patient's history were reviewed and updated as appropriate: allergies, current medications, past family history, past medical history, past social history, past surgical history and problem list.   Objective:   Vitals:   05/27/19 0811  BP: (!) 91/50  Pulse: 75  Weight: 121 lb (54.9 kg)    Fetal Status: Fetal Heart Rate (bpm): 123   Movement: Present     General:  Alert, oriented and cooperative. Patient is in no acute distress.  Skin: Skin is warm and dry. No rash noted.   Cardiovascular: Normal heart rate noted  Respiratory: Normal respiratory effort, no problems with respiration noted  Abdomen: Soft, gravid, appropriate for gestational age.  Pain/Pressure: Absent     Pelvic: Cervical exam deferred        Extremities: Normal range of motion.  Edema: None  Mental Status: Normal mood and affect. Normal behavior. Normal judgment and thought content.   Assessment and Plan:  Pregnancy: G1P0 at [redacted]w[redacted]d 1. Supervision of normal first pregnancy, antepartum FHT and FH normal - CBC - Glucose Tolerance, 2 Hours w/1 Hour - HIV Antibody (routine testing w rflx) - RPR - Tdap vaccine greater than or equal to 7yo IM  Preterm labor symptoms and general obstetric precautions including but not limited to vaginal bleeding, contractions, leaking of fluid and fetal movement were reviewed in detail with the patient. Please refer to After Visit Summary for other counseling recommendations.    Return in about 4 weeks (around 06/24/2019) for OB f/u.  No future appointments.  Levie Heritage, DO

## 2019-05-28 LAB — CBC
Hematocrit: 32.4 % — ABNORMAL LOW (ref 34.0–46.6)
Hemoglobin: 11.1 g/dL (ref 11.1–15.9)
MCH: 26.8 pg (ref 26.6–33.0)
MCHC: 34.3 g/dL (ref 31.5–35.7)
MCV: 78 fL — ABNORMAL LOW (ref 79–97)
Platelets: 171 10*3/uL (ref 150–450)
RBC: 4.14 x10E6/uL (ref 3.77–5.28)
RDW: 14.4 % (ref 11.7–15.4)
WBC: 8.1 10*3/uL (ref 3.4–10.8)

## 2019-05-28 LAB — GLUCOSE TOLERANCE, 2 HOURS W/ 1HR
Glucose, 1 hour: 126 mg/dL (ref 65–179)
Glucose, 2 hour: 85 mg/dL (ref 65–152)
Glucose, Fasting: 62 mg/dL — ABNORMAL LOW (ref 65–91)

## 2019-05-28 LAB — RPR: RPR Ser Ql: NONREACTIVE

## 2019-05-28 LAB — HIV ANTIBODY (ROUTINE TESTING W REFLEX): HIV Screen 4th Generation wRfx: NONREACTIVE

## 2019-06-07 ENCOUNTER — Ambulatory Visit (INDEPENDENT_AMBULATORY_CARE_PROVIDER_SITE_OTHER): Payer: Managed Care, Other (non HMO) | Admitting: Advanced Practice Midwife

## 2019-06-07 ENCOUNTER — Other Ambulatory Visit: Payer: Self-pay

## 2019-06-07 ENCOUNTER — Encounter: Payer: Self-pay | Admitting: Advanced Practice Midwife

## 2019-06-07 VITALS — BP 94/53 | HR 78 | Ht 65.0 in | Wt 126.0 lb

## 2019-06-07 DIAGNOSIS — B9689 Other specified bacterial agents as the cause of diseases classified elsewhere: Secondary | ICD-10-CM

## 2019-06-07 DIAGNOSIS — Z3400 Encounter for supervision of normal first pregnancy, unspecified trimester: Secondary | ICD-10-CM

## 2019-06-07 DIAGNOSIS — N898 Other specified noninflammatory disorders of vagina: Secondary | ICD-10-CM | POA: Diagnosis not present

## 2019-06-07 DIAGNOSIS — Z113 Encounter for screening for infections with a predominantly sexual mode of transmission: Secondary | ICD-10-CM | POA: Diagnosis not present

## 2019-06-07 DIAGNOSIS — B3731 Acute candidiasis of vulva and vagina: Secondary | ICD-10-CM

## 2019-06-07 DIAGNOSIS — B373 Candidiasis of vulva and vagina: Secondary | ICD-10-CM

## 2019-06-07 DIAGNOSIS — Z3A27 27 weeks gestation of pregnancy: Secondary | ICD-10-CM

## 2019-06-07 DIAGNOSIS — Z3402 Encounter for supervision of normal first pregnancy, second trimester: Secondary | ICD-10-CM

## 2019-06-07 DIAGNOSIS — N760 Acute vaginitis: Secondary | ICD-10-CM

## 2019-06-07 MED ORDER — TERCONAZOLE 0.4 % VA CREA: 1.0000 | TOPICAL_CREAM | Freq: Every day | VAGINAL | 0 refills | Status: DC

## 2019-06-07 NOTE — Patient Instructions (Signed)
Third Trimester of Pregnancy The third trimester is from week 28 through week 40 (months 7 through 9). The third trimester is a time when the unborn baby (fetus) is growing rapidly. At the end of the ninth month, the fetus is about 20 inches in length and weighs 6-10 pounds. Body changes during your third trimester Your body will continue to go through many changes during pregnancy. The changes vary from woman to woman. During the third trimester:  Your weight will continue to increase. You can expect to gain 25-35 pounds (11-16 kg) by the end of the pregnancy.  You may begin to get stretch marks on your hips, abdomen, and breasts.  You may urinate more often because the fetus is moving lower into your pelvis and pressing on your bladder.  You may develop or continue to have heartburn. This is caused by increased hormones that slow down muscles in the digestive tract.  You may develop or continue to have constipation because increased hormones slow digestion and cause the muscles that push waste through your intestines to relax.  You may develop hemorrhoids. These are swollen veins (varicose veins) in the rectum that can itch or be painful.  You may develop swollen, bulging veins (varicose veins) in your legs.  You may have increased body aches in the pelvis, back, or thighs. This is due to weight gain and increased hormones that are relaxing your joints.  You may have changes in your hair. These can include thickening of your hair, rapid growth, and changes in texture. Some women also have hair loss during or after pregnancy, or hair that feels dry or thin. Your hair will most likely return to normal after your baby is born.  Your breasts will continue to grow and they will continue to become tender. A yellow fluid (colostrum) may leak from your breasts. This is the first milk you are producing for your baby.  Your belly button may stick out.  You may notice more swelling in your hands,  face, or ankles.  You may have increased tingling or numbness in your hands, arms, and legs. The skin on your belly may also feel numb.  You may feel short of breath because of your expanding uterus.  You may have more problems sleeping. This can be caused by the size of your belly, increased need to urinate, and an increase in your body's metabolism.  You may notice the fetus "dropping," or moving lower in your abdomen (lightening).  You may have increased vaginal discharge.  You may notice your joints feel loose and you may have pain around your pelvic bone. What to expect at prenatal visits You will have prenatal exams every 2 weeks until week 36. Then you will have weekly prenatal exams. During a routine prenatal visit:  You will be weighed to make sure you and the baby are growing normally.  Your blood pressure will be taken.  Your abdomen will be measured to track your baby's growth.  The fetal heartbeat will be listened to.  Any test results from the previous visit will be discussed.  You may have a cervical check near your due date to see if your cervix has softened or thinned (effaced).  You will be tested for Group B streptococcus. This happens between 35 and 37 weeks. Your health care provider may ask you:  What your birth plan is.  How you are feeling.  If you are feeling the baby move.  If you have had any abnormal   symptoms, such as leaking fluid, bleeding, severe headaches, or abdominal cramping.  If you are using any tobacco products, including cigarettes, chewing tobacco, and electronic cigarettes.  If you have any questions. Other tests or screenings that may be performed during your third trimester include:  Blood tests that check for low iron levels (anemia).  Fetal testing to check the health, activity level, and growth of the fetus. Testing is done if you have certain medical conditions or if there are problems during the pregnancy.  Nonstress test  (NST). This test checks the health of your baby to make sure there are no signs of problems, such as the baby not getting enough oxygen. During this test, a belt is placed around your belly. The baby is made to move, and its heart rate is monitored during movement. What is false labor? False labor is a condition in which you feel small, irregular tightenings of the muscles in the womb (contractions) that usually go away with rest, changing position, or drinking water. These are called Braxton Hicks contractions. Contractions may last for hours, days, or even weeks before true labor sets in. If contractions come at regular intervals, become more frequent, increase in intensity, or become painful, you should see your health care provider. What are the signs of labor?  Abdominal cramps.  Regular contractions that start at 10 minutes apart and become stronger and more frequent with time.  Contractions that start on the top of the uterus and spread down to the lower abdomen and back.  Increased pelvic pressure and dull back pain.  A watery or bloody mucus discharge that comes from the vagina.  Leaking of amniotic fluid. This is also known as your "water breaking." It could be a slow trickle or a gush. Let your health care provider know if it has a color or strange odor. If you have any of these signs, call your health care provider right away, even if it is before your due date. Follow these instructions at home: Medicines  Follow your health care provider's instructions regarding medicine use. Specific medicines may be either safe or unsafe to take during pregnancy.  Take a prenatal vitamin that contains at least 600 micrograms (mcg) of folic acid.  If you develop constipation, try taking a stool softener if your health care provider approves. Eating and drinking   Eat a balanced diet that includes fresh fruits and vegetables, whole grains, good sources of protein such as meat, eggs, or tofu,  and low-fat dairy. Your health care provider will help you determine the amount of weight gain that is right for you.  Avoid raw meat and uncooked cheese. These carry germs that can cause birth defects in the baby.  If you have low calcium intake from food, talk to your health care provider about whether you should take a daily calcium supplement.  Eat four or five small meals rather than three large meals a day.  Limit foods that are high in fat and processed sugars, such as fried and sweet foods.  To prevent constipation: ? Drink enough fluid to keep your urine clear or pale yellow. ? Eat foods that are high in fiber, such as fresh fruits and vegetables, whole grains, and beans. Activity  Exercise only as directed by your health care provider. Most women can continue their usual exercise routine during pregnancy. Try to exercise for 30 minutes at least 5 days a week. Stop exercising if you experience uterine contractions.  Avoid heavy lifting.  Do   not exercise in extreme heat or humidity, or at high altitudes.  Wear low-heel, comfortable shoes.  Practice good posture.  You may continue to have sex unless your health care provider tells you otherwise. Relieving pain and discomfort  Take frequent breaks and rest with your legs elevated if you have leg cramps or low back pain.  Take warm sitz baths to soothe any pain or discomfort caused by hemorrhoids. Use hemorrhoid cream if your health care provider approves.  Wear a good support bra to prevent discomfort from breast tenderness.  If you develop varicose veins: ? Wear support pantyhose or compression stockings as told by your healthcare provider. ? Elevate your feet for 15 minutes, 3-4 times a day. Prenatal care  Write down your questions. Take them to your prenatal visits.  Keep all your prenatal visits as told by your health care provider. This is important. Safety  Wear your seat belt at all times when driving.  Make  a list of emergency phone numbers, including numbers for family, friends, the hospital, and police and fire departments. General instructions  Avoid cat litter boxes and soil used by cats. These carry germs that can cause birth defects in the baby. If you have a cat, ask someone to clean the litter box for you.  Do not travel far distances unless it is absolutely necessary and only with the approval of your health care provider.  Do not use hot tubs, steam rooms, or saunas.  Do not drink alcohol.  Do not use any products that contain nicotine or tobacco, such as cigarettes and e-cigarettes. If you need help quitting, ask your health care provider.  Do not use any medicinal herbs or unprescribed drugs. These chemicals affect the formation and growth of the baby.  Do not douche or use tampons or scented sanitary pads.  Do not cross your legs for long periods of time.  To prepare for the arrival of your baby: ? Take prenatal classes to understand, practice, and ask questions about labor and delivery. ? Make a trial run to the hospital. ? Visit the hospital and tour the maternity area. ? Arrange for maternity or paternity leave through employers. ? Arrange for family and friends to take care of pets while you are in the hospital. ? Purchase a rear-facing car seat and make sure you know how to install it in your car. ? Pack your hospital bag. ? Prepare the baby's nursery. Make sure to remove all pillows and stuffed animals from the baby's crib to prevent suffocation.  Visit your dentist if you have not gone during your pregnancy. Use a soft toothbrush to brush your teeth and be gentle when you floss. Contact a health care provider if:  You are unsure if you are in labor or if your water has broken.  You become dizzy.  You have mild pelvic cramps, pelvic pressure, or nagging pain in your abdominal area.  You have lower back pain.  You have persistent nausea, vomiting, or  diarrhea.  You have an unusual or bad smelling vaginal discharge.  You have pain when you urinate. Get help right away if:  Your water breaks before 37 weeks.  You have regular contractions less than 5 minutes apart before 37 weeks.  You have a fever.  You are leaking fluid from your vagina.  You have spotting or bleeding from your vagina.  You have severe abdominal pain or cramping.  You have rapid weight loss or weight gain.  You have   shortness of breath with chest pain.  You notice sudden or extreme swelling of your face, hands, ankles, feet, or legs.  Your baby makes fewer than 10 movements in 2 hours.  You have severe headaches that do not go away when you take medicine.  You have vision changes. Summary  The third trimester is from week 28 through week 40, months 7 through 9. The third trimester is a time when the unborn baby (fetus) is growing rapidly.  During the third trimester, your discomfort may increase as you and your baby continue to gain weight. You may have abdominal, leg, and back pain, sleeping problems, and an increased need to urinate.  During the third trimester your breasts will keep growing and they will continue to become tender. A yellow fluid (colostrum) may leak from your breasts. This is the first milk you are producing for your baby.  False labor is a condition in which you feel small, irregular tightenings of the muscles in the womb (contractions) that eventually go away. These are called Braxton Hicks contractions. Contractions may last for hours, days, or even weeks before true labor sets in.  Signs of labor can include: abdominal cramps; regular contractions that start at 10 minutes apart and become stronger and more frequent with time; watery or bloody mucus discharge that comes from the vagina; increased pelvic pressure and dull back pain; and leaking of amniotic fluid. This information is not intended to replace advice given to you by your  health care provider. Make sure you discuss any questions you have with your health care provider. Document Revised: 07/29/2018 Document Reviewed: 05/13/2016 Elsevier Patient Education  2020 [employer]  

## 2019-06-07 NOTE — Progress Notes (Addendum)
   PRENATAL VISIT NOTE  Subjective:  Christina Reeves is a 24 y.o. G1P0 at [redacted]w[redacted]d being seen today for ongoing prenatal care.  She is currently monitored for the following issues for this low-risk pregnancy and has Supervision of normal first pregnancy, antepartum; Patient underweight; Lactose intolerance; and Genetic carrier status on their problem list.  Patient reports vaginal irritation.   .  .   . Denies leaking of fluid.   The following portions of the patient's history were reviewed and updated as appropriate: allergies, current medications, past family history, past medical history, past social history, past surgical history and problem list.   Objective:   Vitals:   06/07/19 0832  BP: (!) 94/53  Pulse: 78  Weight: 126 lb (57.2 kg)  Height: 5\' 5"  (1.651 m)    Fetal Status:           General:  Alert, oriented and cooperative. Patient is in no acute distress.  Skin: Skin is warm and dry. No rash noted.   Cardiovascular: Normal heart rate noted  Respiratory: Normal respiratory effort, no problems with respiration noted  Abdomen: Soft, gravid, appropriate for gestational age.        Pelvic: Cervical exam deferred      Small amt of curdlike discharge   Fundal Height 27cm  Extremities: Normal range of motion.     Mental Status: Normal mood and affect. Normal behavior. Normal judgment and thought content.   Assessment and Plan:  Pregnancy: G1P0 at [redacted]w[redacted]d 1. Yeast vaginitis     Wet prep sent      Will treat presumptively for yeast vaginitis     Rx Terazol 7 - Cervicovaginal ancillary only( [place])  2. Supervision of normal first pregnancy, antepartum    Reviewed normal glucola results  Preterm labor symptoms and general obstetric precautions including but not limited to vaginal bleeding, contractions, leaking of fluid and fetal movement were reviewed in detail with the patient. Please refer to After Visit Summary for other counseling recommendations.   Return in about 2  weeks (around 06/21/2019) for 08/21/2019, TELEHEALTH VISIT.  Future Appointments  Date Time Provider Department Center  06/24/2019  8:15 AM 08/24/2019, DO [facility] None    Levie Heritage, CNM

## 2019-06-08 LAB — CERVICOVAGINAL ANCILLARY ONLY
Bacterial Vaginitis (gardnerella): POSITIVE — AB
Candida Glabrata: NEGATIVE
Candida Vaginitis: POSITIVE — AB
Comment: NEGATIVE
Comment: NEGATIVE
Comment: NEGATIVE
Comment: NEGATIVE
Trichomonas: NEGATIVE

## 2019-06-10 ENCOUNTER — Telehealth: Payer: Self-pay

## 2019-06-10 DIAGNOSIS — B9689 Other specified bacterial agents as the cause of diseases classified elsewhere: Secondary | ICD-10-CM

## 2019-06-10 MED ORDER — METRONIDAZOLE 500 MG PO TABS: 500.0000 mg | ORAL_TABLET | Freq: Two times a day (BID) | ORAL | 0 refills | Status: DC

## 2019-06-10 NOTE — Telephone Encounter (Signed)
Called pt with positive BV and yeast results. Pt states she is already being treated for yeast. Metronidazole was sent to the pharmacy. Understanding was voiced. Mendel Binsfeld l Thania Woodlief, CMA

## 2019-06-23 ENCOUNTER — Encounter: Payer: Managed Care, Other (non HMO) | Admitting: Family Medicine

## 2019-06-24 ENCOUNTER — Telehealth (INDEPENDENT_AMBULATORY_CARE_PROVIDER_SITE_OTHER): Payer: Managed Care, Other (non HMO) | Admitting: Family Medicine

## 2019-06-24 DIAGNOSIS — Z3400 Encounter for supervision of normal first pregnancy, unspecified trimester: Secondary | ICD-10-CM

## 2019-06-24 DIAGNOSIS — Z3403 Encounter for supervision of normal first pregnancy, third trimester: Secondary | ICD-10-CM

## 2019-06-24 DIAGNOSIS — Z148 Genetic carrier of other disease: Secondary | ICD-10-CM

## 2019-06-24 DIAGNOSIS — Z3A30 30 weeks gestation of pregnancy: Secondary | ICD-10-CM

## 2019-06-24 NOTE — Progress Notes (Signed)
   TELEHEALTH VIRTUAL OBSTETRICS VISIT ENCOUNTER NOTE  I connected with Silvestre Moment on 06/24/19 at  8:15 AM EST by telephone at home and verified that I am speaking with the correct person using two identifiers.   I discussed the limitations, risks, security and privacy concerns of performing an evaluation and management service by telephone and the availability of in person appointments. I also discussed with the patient that there may be a patient responsible charge related to this service. The patient expressed understanding and agreed to proceed.  Subjective:  Christina Reeves is a 24 y.o. G1P0 at [redacted]w[redacted]d being followed for ongoing prenatal care.  She is currently monitored for the following issues for this low-risk pregnancy and has Supervision of normal first pregnancy, antepartum; Patient underweight; Lactose intolerance; and Genetic carrier status on their problem list.  Patient reports no complaints. Reports fetal movement. Denies any contractions, bleeding or leaking of fluid.   The following portions of the patient's history were reviewed and updated as appropriate: allergies, current medications, past family history, past medical history, past social history, past surgical history and problem list.   Objective:   General:  Alert, oriented and cooperative.   Mental Status: Normal mood and affect perceived. Normal judgment and thought content.  Rest of physical exam deferred due to type of encounter  Assessment and Plan:  Pregnancy: G1P0 at [redacted]w[redacted]d  1. Supervision of normal first pregnancy, antepartum Good fetal movement. Heartburn controlled  2. 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   Preterm labor symptoms and general obstetric precautions including but not limited to vaginal bleeding, contractions, leaking of fluid and fetal movement were reviewed in detail with the patient.  I discussed the assessment and treatment plan with the patient. The patient was provided an  opportunity to ask questions and all were answered. The patient agreed with the plan and demonstrated an understanding of the instructions. The patient was advised to call back or seek an in-person office evaluation/go to [facility] at [facility] for any urgent or concerning symptoms. Please refer to After Visit Summary for other counseling recommendations.   I provided 11 minutes of non-face-to-face time during this encounter.  No follow-ups on file.  No future appointments.  Levie Heritage, DO [facility] [employer], [facility]

## 2019-07-05 ENCOUNTER — Encounter: Payer: Self-pay | Admitting: Advanced Practice Midwife

## 2019-07-05 ENCOUNTER — Telehealth (INDEPENDENT_AMBULATORY_CARE_PROVIDER_SITE_OTHER): Payer: Managed Care, Other (non HMO) | Admitting: Advanced Practice Midwife

## 2019-07-05 DIAGNOSIS — B373 Candidiasis of vulva and vagina: Secondary | ICD-10-CM

## 2019-07-05 DIAGNOSIS — Z3400 Encounter for supervision of normal first pregnancy, unspecified trimester: Secondary | ICD-10-CM

## 2019-07-05 DIAGNOSIS — O98813 Other maternal infectious and parasitic diseases complicating pregnancy, third trimester: Secondary | ICD-10-CM

## 2019-07-05 DIAGNOSIS — B3731 Acute candidiasis of vulva and vagina: Secondary | ICD-10-CM

## 2019-07-05 MED ORDER — FLUCONAZOLE 150 MG PO TABS: 150.0000 mg | ORAL_TABLET | Freq: Once | ORAL | 3 refills | Status: AC

## 2019-07-05 NOTE — Progress Notes (Signed)
   TELEHEALTH VIRTUAL OBSTETRICS VISIT ENCOUNTER NOTE  I connected with Silvestre Moment on 07/05/19 at  8:30 AM EDT by telephone at home and verified that I am speaking with the correct person using two identifiers.   I discussed the limitations, risks, security and privacy concerns of performing an evaluation and management service by telephone and the availability of in person appointments. I also discussed with the patient that there may be a patient responsible charge related to this service. The patient expressed understanding and agreed to proceed.  Attempted video visit but connection was interrupted, so switched to phone   Subjective:  Christina Reeves is a 24 y.o. G1P0 at [redacted]w[redacted]d being followed for ongoing prenatal care.  She is currently monitored for the following issues for this low-risk pregnancy and has Supervision of normal first pregnancy, antepartum; Patient underweight; Lactose intolerance; and Genetic carrier status on their problem list.  Patient reports vaginal irritation.  Thick yellow discharge.   Reports fetal movement. Denies any contractions, bleeding or leaking of fluid.   The following portions of the patient's history were reviewed and updated as appropriate: allergies, current medications, past family history, past medical history, past social history, past surgical history and problem list.   Objective:   General:  Alert, oriented and cooperative.   Mental Status: Normal mood and affect perceived. Normal judgment and thought content.  Rest of physical exam deferred due to type of encounter  Assessment and Plan:  Pregnancy: G1P0 at [redacted]w[redacted]d  RX Diflucan sent to pharmacy  Prefers not to use cream this time  Preterm labor symptoms and general obstetric precautions including but not limited to vaginal bleeding, contractions, leaking of fluid and fetal movement were reviewed in detail with the patient.  I discussed the assessment and treatment plan with the patient. The  patient was provided an opportunity to ask questions and all were answered. The patient agreed with the plan and demonstrated an understanding of the instructions. The patient was advised to call back or seek an in-person office evaluation/go to [facility] at [facility] for any urgent or concerning symptoms. Please refer to After Visit Summary for other counseling recommendations.   I provided 6 minutes of non-face-to-face time during this encounter.  RTO 2 weeks  Wynelle Bourgeois, CNM [facility] [employer], [facility]

## 2019-07-14 ENCOUNTER — Other Ambulatory Visit: Payer: Self-pay | Admitting: Family Medicine

## 2019-07-19 ENCOUNTER — Other Ambulatory Visit: Payer: Self-pay

## 2019-07-19 ENCOUNTER — Encounter: Payer: Self-pay | Admitting: Advanced Practice Midwife

## 2019-07-19 ENCOUNTER — Ambulatory Visit (INDEPENDENT_AMBULATORY_CARE_PROVIDER_SITE_OTHER): Payer: Managed Care, Other (non HMO) | Admitting: Advanced Practice Midwife

## 2019-07-19 DIAGNOSIS — Z3A33 33 weeks gestation of pregnancy: Secondary | ICD-10-CM

## 2019-07-19 DIAGNOSIS — Z3403 Encounter for supervision of normal first pregnancy, third trimester: Secondary | ICD-10-CM

## 2019-07-19 DIAGNOSIS — Z3400 Encounter for supervision of normal first pregnancy, unspecified trimester: Secondary | ICD-10-CM

## 2019-07-19 NOTE — Patient Instructions (Signed)
Third Trimester of Pregnancy The third trimester is from week 28 through week 40 (months 7 through 9). The third trimester is a time when the unborn baby (fetus) is growing rapidly. At the end of the ninth month, the fetus is about 20 inches in length and weighs 6-10 pounds. Body changes during your third trimester Your body will continue to go through many changes during pregnancy. The changes vary from woman to woman. During the third trimester:  Your weight will continue to increase. You can expect to gain 25-35 pounds (11-16 kg) by the end of the pregnancy.  You may begin to get stretch marks on your hips, abdomen, and breasts.  You may urinate more often because the fetus is moving lower into your pelvis and pressing on your bladder.  You may develop or continue to have heartburn. This is caused by increased hormones that slow down muscles in the digestive tract.  You may develop or continue to have constipation because increased hormones slow digestion and cause the muscles that push waste through your intestines to relax.  You may develop hemorrhoids. These are swollen veins (varicose veins) in the rectum that can itch or be painful.  You may develop swollen, bulging veins (varicose veins) in your legs.  You may have increased body aches in the pelvis, back, or thighs. This is due to weight gain and increased hormones that are relaxing your joints.  You may have changes in your hair. These can include thickening of your hair, rapid growth, and changes in texture. Some women also have hair loss during or after pregnancy, or hair that feels dry or thin. Your hair will most likely return to normal after your baby is born.  Your breasts will continue to grow and they will continue to become tender. A yellow fluid (colostrum) may leak from your breasts. This is the first milk you are producing for your baby.  Your belly button may stick out.  You may notice more swelling in your hands,  face, or ankles.  You may have increased tingling or numbness in your hands, arms, and legs. The skin on your belly may also feel numb.  You may feel short of breath because of your expanding uterus.  You may have more problems sleeping. This can be caused by the size of your belly, increased need to urinate, and an increase in your body's metabolism.  You may notice the fetus "dropping," or moving lower in your abdomen (lightening).  You may have increased vaginal discharge.  You may notice your joints feel loose and you may have pain around your pelvic bone. What to expect at prenatal visits You will have prenatal exams every 2 weeks until week 36. Then you will have weekly prenatal exams. During a routine prenatal visit:  You will be weighed to make sure you and the baby are growing normally.  Your blood pressure will be taken.  Your abdomen will be measured to track your baby's growth.  The fetal heartbeat will be listened to.  Any test results from the previous visit will be discussed.  You may have a cervical check near your due date to see if your cervix has softened or thinned (effaced).  You will be tested for Group B streptococcus. This happens between 35 and 37 weeks. Your health care provider may ask you:  What your birth plan is.  How you are feeling.  If you are feeling the baby move.  If you have had any abnormal   symptoms, such as leaking fluid, bleeding, severe headaches, or abdominal cramping.  If you are using any tobacco products, including cigarettes, chewing tobacco, and electronic cigarettes.  If you have any questions. Other tests or screenings that may be performed during your third trimester include:  Blood tests that check for low iron levels (anemia).  Fetal testing to check the health, activity level, and growth of the fetus. Testing is done if you have certain medical conditions or if there are problems during the pregnancy.  Nonstress test  (NST). This test checks the health of your baby to make sure there are no signs of problems, such as the baby not getting enough oxygen. During this test, a belt is placed around your belly. The baby is made to move, and its heart rate is monitored during movement. What is false labor? False labor is a condition in which you feel small, irregular tightenings of the muscles in the womb (contractions) that usually go away with rest, changing position, or drinking water. These are called Braxton Hicks contractions. Contractions may last for hours, days, or even weeks before true labor sets in. If contractions come at regular intervals, become more frequent, increase in intensity, or become painful, you should see your health care provider. What are the signs of labor?  Abdominal cramps.  Regular contractions that start at 10 minutes apart and become stronger and more frequent with time.  Contractions that start on the top of the uterus and spread down to the lower abdomen and back.  Increased pelvic pressure and dull back pain.  A watery or bloody mucus discharge that comes from the vagina.  Leaking of amniotic fluid. This is also known as your "water breaking." It could be a slow trickle or a gush. Let your health care provider know if it has a color or strange odor. If you have any of these signs, call your health care provider right away, even if it is before your due date. Follow these instructions at home: Medicines  Follow your health care provider's instructions regarding medicine use. Specific medicines may be either safe or unsafe to take during pregnancy.  Take a prenatal vitamin that contains at least 600 micrograms (mcg) of folic acid.  If you develop constipation, try taking a stool softener if your health care provider approves. Eating and drinking   Eat a balanced diet that includes fresh fruits and vegetables, whole grains, good sources of protein such as meat, eggs, or tofu,  and low-fat dairy. Your health care provider will help you determine the amount of weight gain that is right for you.  Avoid raw meat and uncooked cheese. These carry germs that can cause birth defects in the baby.  If you have low calcium intake from food, talk to your health care provider about whether you should take a daily calcium supplement.  Eat four or five small meals rather than three large meals a day.  Limit foods that are high in fat and processed sugars, such as fried and sweet foods.  To prevent constipation: ? Drink enough fluid to keep your urine clear or pale yellow. ? Eat foods that are high in fiber, such as fresh fruits and vegetables, whole grains, and beans. Activity  Exercise only as directed by your health care provider. Most women can continue their usual exercise routine during pregnancy. Try to exercise for 30 minutes at least 5 days a week. Stop exercising if you experience uterine contractions.  Avoid heavy lifting.  Do   not exercise in extreme heat or humidity, or at high altitudes.  Wear low-heel, comfortable shoes.  Practice good posture.  You may continue to have sex unless your health care provider tells you otherwise. Relieving pain and discomfort  Take frequent breaks and rest with your legs elevated if you have leg cramps or low back pain.  Take warm sitz baths to soothe any pain or discomfort caused by hemorrhoids. Use hemorrhoid cream if your health care provider approves.  Wear a good support bra to prevent discomfort from breast tenderness.  If you develop varicose veins: ? Wear support pantyhose or compression stockings as told by your healthcare provider. ? Elevate your feet for 15 minutes, 3-4 times a day. Prenatal care  Write down your questions. Take them to your prenatal visits.  Keep all your prenatal visits as told by your health care provider. This is important. Safety  Wear your seat belt at all times when driving.  Make  a list of emergency phone numbers, including numbers for family, friends, the hospital, and police and fire departments. General instructions  Avoid cat litter boxes and soil used by cats. These carry germs that can cause birth defects in the baby. If you have a cat, ask someone to clean the litter box for you.  Do not travel far distances unless it is absolutely necessary and only with the approval of your health care provider.  Do not use hot tubs, steam rooms, or saunas.  Do not drink alcohol.  Do not use any products that contain nicotine or tobacco, such as cigarettes and e-cigarettes. If you need help quitting, ask your health care provider.  Do not use any medicinal herbs or unprescribed drugs. These chemicals affect the formation and growth of the baby.  Do not douche or use tampons or scented sanitary pads.  Do not cross your legs for long periods of time.  To prepare for the arrival of your baby: ? Take prenatal classes to understand, practice, and ask questions about labor and delivery. ? Make a trial run to the hospital. ? Visit the hospital and tour the maternity area. ? Arrange for maternity or paternity leave through employers. ? Arrange for family and friends to take care of pets while you are in the hospital. ? Purchase a rear-facing car seat and make sure you know how to install it in your car. ? Pack your hospital bag. ? Prepare the baby's nursery. Make sure to remove all pillows and stuffed animals from the baby's crib to prevent suffocation.  Visit your dentist if you have not gone during your pregnancy. Use a soft toothbrush to brush your teeth and be gentle when you floss. Contact a health care provider if:  You are unsure if you are in labor or if your water has broken.  You become dizzy.  You have mild pelvic cramps, pelvic pressure, or nagging pain in your abdominal area.  You have lower back pain.  You have persistent nausea, vomiting, or  diarrhea.  You have an unusual or bad smelling vaginal discharge.  You have pain when you urinate. Get help right away if:  Your water breaks before 37 weeks.  You have regular contractions less than 5 minutes apart before 37 weeks.  You have a fever.  You are leaking fluid from your vagina.  You have spotting or bleeding from your vagina.  You have severe abdominal pain or cramping.  You have rapid weight loss or weight gain.  You have   shortness of breath with chest pain.  You notice sudden or extreme swelling of your face, hands, ankles, feet, or legs.  Your baby makes fewer than 10 movements in 2 hours.  You have severe headaches that do not go away when you take medicine.  You have vision changes. Summary  The third trimester is from week 28 through week 40, months 7 through 9. The third trimester is a time when the unborn baby (fetus) is growing rapidly.  During the third trimester, your discomfort may increase as you and your baby continue to gain weight. You may have abdominal, leg, and back pain, sleeping problems, and an increased need to urinate.  During the third trimester your breasts will keep growing and they will continue to become tender. A yellow fluid (colostrum) may leak from your breasts. This is the first milk you are producing for your baby.  False labor is a condition in which you feel small, irregular tightenings of the muscles in the womb (contractions) that eventually go away. These are called Braxton Hicks contractions. Contractions may last for hours, days, or even weeks before true labor sets in.  Signs of labor can include: abdominal cramps; regular contractions that start at 10 minutes apart and become stronger and more frequent with time; watery or bloody mucus discharge that comes from the vagina; increased pelvic pressure and dull back pain; and leaking of amniotic fluid. This information is not intended to replace advice given to you by your  health care provider. Make sure you discuss any questions you have with your health care provider. Document Revised: 07/29/2018 Document Reviewed: 05/13/2016 Elsevier Patient Education  2020 [employer]  

## 2019-07-19 NOTE — Progress Notes (Signed)
   PRENATAL VISIT NOTE  Subjective:  Christina Reeves is a 24 y.o. G1P0 at [redacted]w[redacted]d being seen today for ongoing prenatal care.  She is currently monitored for the following issues for this low-risk pregnancy and has Supervision of normal first pregnancy, antepartum; Patient underweight; Lactose intolerance; and Genetic carrier status on their problem list.  Patient reports no complaints.  Contractions: Not present. Vag. Bleeding: None.  Movement: Present. Denies leaking of fluid.   The following portions of the patient's history were reviewed and updated as appropriate: allergies, current medications, past family history, past medical history, past social history, past surgical history and problem list.   Objective:   Vitals:   07/19/19 1114  BP: 99/69  Pulse: 69  Weight: 128 lb (58.1 kg)    Fetal Status: Fetal Heart Rate (bpm): 128   Movement: Present     General:  Alert, oriented and cooperative. Patient is in no acute distress.  Skin: Skin is warm and dry. No rash noted.   Cardiovascular: Normal heart rate noted  Respiratory: Normal respiratory effort, no problems with respiration noted  Abdomen: Soft, gravid, appropriate for gestational age.  Pain/Pressure: Absent     Pelvic: Cervical exam deferred        Extremities: Normal range of motion.  Edema: None  Mental Status: Normal mood and affect. Normal behavior. Normal judgment and thought content.   Assessment and Plan:  Pregnancy: G1P0 at [redacted]w[redacted]d 1. Supervision of normal first pregnancy, antepartum     Reviewed status and plan for remainder of pregnancy     Reviewed where to go for MAU/emergency care     Plan GBS and cultures in 2 weeks  Preterm labor symptoms and general obstetric precautions including but not limited to vaginal bleeding, contractions, leaking of fluid and fetal movement were reviewed in detail with the patient. Please refer to After Visit Summary for other counseling recommendations.   Return in about 2 weeks  (around 08/02/2019) for [facility]  No future appointments.  Wynelle Bourgeois, CNM

## 2019-07-27 ENCOUNTER — Other Ambulatory Visit: Payer: Self-pay

## 2019-07-27 DIAGNOSIS — O219 Vomiting of pregnancy, unspecified: Secondary | ICD-10-CM

## 2019-07-27 MED ORDER — DOXYLAMINE-PYRIDOXINE 10-10 MG PO TBEC: DELAYED_RELEASE_TABLET | ORAL | 1 refills | Status: DC

## 2019-08-01 ENCOUNTER — Encounter: Payer: Self-pay | Admitting: Obstetrics & Gynecology

## 2019-08-01 ENCOUNTER — Ambulatory Visit (INDEPENDENT_AMBULATORY_CARE_PROVIDER_SITE_OTHER): Payer: Managed Care, Other (non HMO) | Admitting: Obstetrics & Gynecology

## 2019-08-01 ENCOUNTER — Other Ambulatory Visit: Payer: Self-pay

## 2019-08-01 ENCOUNTER — Other Ambulatory Visit (HOSPITAL_COMMUNITY)
Admission: RE | Admit: 2019-08-01 | Discharge: 2019-08-01 | Disposition: A | Discharge status: Home / Self Care | Payer: Managed Care, Other (non HMO) | Source: Ambulatory Visit | Attending: Obstetrics & Gynecology | Admitting: Obstetrics & Gynecology

## 2019-08-01 VITALS — BP 99/65 | HR 72 | Wt 129.0 lb

## 2019-08-01 DIAGNOSIS — R636 Underweight: Secondary | ICD-10-CM

## 2019-08-01 DIAGNOSIS — Z148 Genetic carrier of other disease: Secondary | ICD-10-CM

## 2019-08-01 DIAGNOSIS — O2613 Low weight gain in pregnancy, third trimester: Secondary | ICD-10-CM

## 2019-08-01 DIAGNOSIS — Z3400 Encounter for supervision of normal first pregnancy, unspecified trimester: Secondary | ICD-10-CM | POA: Diagnosis not present

## 2019-08-01 DIAGNOSIS — Z3A35 35 weeks gestation of pregnancy: Secondary | ICD-10-CM

## 2019-08-01 DIAGNOSIS — E739 Lactose intolerance, unspecified: Secondary | ICD-10-CM

## 2019-08-01 NOTE — Patient Instructions (Signed)
Natural Childbirth Natural childbirth is when labor and delivery progresses naturally with minimal medical assistance or medicines. Natural childbirth may be an option if you have a low risk pregnancy. With the help of a birthing professional such as a midwife, doula, or other health care provider, you may be able to use relaxation techniques and controlled breathing to manage pain during labor. Many women choose natural childbirth because it makes them feel more in control and in touch with the experience of giving birth. Some women also choose natural childbirth because they are concerned that medicines may affect them and their babies. What types of natural childbirth techniques are available? There are two types of natural childbirth techniques, which are taught in classes:  The Lamaze method. In these classes, parents learn that having a baby is normal, healthy, and natural. Mothers are taught to take a neutral position regarding pain medicine and numbing medicines, and to make an informed decision about using these medicines if the time comes.  The Bradley method, also called husband-coached birth. In these classes, the father or partner learns to be the birth coach. The mother is encouraged to exercise and eat a balanced, nutritious diet. Both parents also learn relaxation and deep breathing exercises and are taught how to prepare for emergency situations. What are some natural pain and relaxation techniques? If you are considering a natural childbirth, you should explore your options for managing pain and discomfort during your labor and delivery. Some natural pain and relaxation techniques include:  Meditation.  Yoga.  Hypnosis.  Acupuncture.  Massage.  Changing positions, such as by walking, rocking, showering, or leaning on birth balls.  Lying in warm water or a whirlpool bath.  Finding an activity that keeps your mind off the labor pain.  Listening to soft music.  Focusing  on a particular object (visual imagery). How can I prepare for a natural birth?  Talk with your spouse or partner about your goals for having a natural childbirth.  Decide if you will have your baby in the hospital, at a birthing center, or at home.  Have your spouse or partner attend the natural childbirth technique classes with you.  Decide which type of health care provider you would like to assist you with your delivery.  If you have other children, make plans to have someone take care of them when you go to the hospital or birthing center.  Know the distance and the time it takes to go to the hospital or birthing center. Practice going there and time it to be sure.  Have a bag packed with a nightgown, bathrobe, and toiletries. Be ready to take it with you when you go into labor.  Keep phone numbers of your family and friends handy if you need to call someone when you go into labor.  Talk with your health care provider about the possibility of a medical emergency and what will happen if that occurs. What are the advantages and disadvantages of natural childbirth? Advantages  You are in control of your labor and delivery experience.  You may be able to avoid some common medical practices, such as getting medicines or being monitored all the time.  You and your spouse or partner will work together, which can increase your bond with each other.  In most delivery centers, your family and friends can be involved in the labor and delivery process. Disadvantages  The methods of helping relieve labor pains may not work for you.  You may feel   disappointed if you change your mind during labor and decide not to have a natural childbirth. What can I expect after delivery?  You may feel very tired.  You may feel uncomfortable as your uterus contracts.  The area around your vagina will feel sore.  You may feel cold and shaky. What questions should I ask my health care  provider?  Am I a good candidate for natural childbirth?  Can you refer me to classes to learn more about natural childbirth?  How do I create a birth plan that outlines my wishes for natural childbirth? Where to find more information  American Pregnancy Association: americanpregnancy.org  American Congress of Obstetricians and Gynecologists: acog.org  American College of Nurse-Midwives: www.midwife.org Summary  Natural childbirth may be an option if you have a low risk pregnancy.  The Bradley or Lamaze Methods are techniques that can assist you in achieving a natural birth experience.  Talk to your health care provider to determine if you are a good candidate for a natural childbirth. This information is not intended to replace advice given to you by your health care provider. Make sure you discuss any questions you have with your health care provider. Document Revised: 07/30/2018 Document Reviewed: 06/16/2016 Elsevier Patient Education  2020 Elsevier Inc.  

## 2019-08-01 NOTE — Progress Notes (Signed)
   PRENATAL VISIT NOTE  Subjective:  Christina Reeves is a 24 y.o. G1P0 at [redacted]w[redacted]d being seen today for ongoing prenatal care.  She is currently monitored for the following issues for this low-risk pregnancy and has Supervision of normal first pregnancy, antepartum; Patient underweight; Lactose intolerance; and Genetic carrier status on their problem list.  Patient reports no complaints. Denies leaking of fluid. No vaginal bleeding. No CTX.    The following portions of the patient's history were reviewed and updated as appropriate: allergies, current medications, past family history, past medical history, past social history, past surgical history and problem list.   Objective:  There were no vitals filed for this visit.  Fetal Status:           General:  Alert, oriented and cooperative. Patient is in no acute distress.  Skin: Skin is warm and dry. No rash noted.   Cardiovascular: Normal heart rate noted  Respiratory: Normal respiratory effort, no problems with respiration noted  Abdomen: Soft, gravid, appropriate for gestational age.        Pelvic: Cervical exam performed in the presence of a chaperone        Extremities: Normal range of motion.     Mental Status: Normal mood and affect. Normal behavior. Normal judgment and thought content.   Assessment and Plan:  Pregnancy: G1P0 at [redacted]w[redacted]d 1. Supervision of normal first pregnancy, antepartum FH and FHR WNL  2. 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 Patient underweight Total weight gain 24#'s   4. Lactose intolerance   Preterm labor symptoms and general obstetric precautions including but not limited to vaginal bleeding, contractions, leaking of fluid and fetal movement were reviewed in detail with the patient. Please refer to After Visit Summary for other counseling recommendations.   Return in about 1 week (around 08/08/2019).  No future appointments.  Willodean Rosenthal, MD

## 2019-08-02 ENCOUNTER — Telehealth: Payer: Self-pay

## 2019-08-02 LAB — GC/CHLAMYDIA PROBE AMP (~~LOC~~) NOT AT ARMC
Chlamydia: NEGATIVE
Comment: NEGATIVE
Comment: NORMAL
Neisseria Gonorrhea: NEGATIVE

## 2019-08-02 NOTE — Telephone Encounter (Signed)
Called patient. She is going to take zofran 30 min before meal to see if this helps. She also has diclegis to add. She will also try the ensure drinks.

## 2019-08-02 NOTE — Telephone Encounter (Signed)
Patient called stating that she is unable to keep much down. Patient was seen by Dr. Erin Fulling yesterday and encouraged to gain weight. Patient states she is able to keep down water. Patient states she got sick this morning after breakfast and after lunch today.  Told patient she could try drinking something like ensure protein drinks to see if she can tolerate those. Will route to provider for additional input. Armandina Stammer RN

## 2019-08-05 LAB — CULTURE, BETA STREP (GROUP B ONLY): Strep Gp B Culture: NEGATIVE

## 2019-08-11 ENCOUNTER — Ambulatory Visit (INDEPENDENT_AMBULATORY_CARE_PROVIDER_SITE_OTHER): Payer: Managed Care, Other (non HMO) | Admitting: Family Medicine

## 2019-08-11 ENCOUNTER — Other Ambulatory Visit: Payer: Self-pay

## 2019-08-11 VITALS — BP 119/74 | HR 79 | Wt 132.0 lb

## 2019-08-11 DIAGNOSIS — R636 Underweight: Secondary | ICD-10-CM

## 2019-08-11 DIAGNOSIS — O2613 Low weight gain in pregnancy, third trimester: Secondary | ICD-10-CM

## 2019-08-11 DIAGNOSIS — Z148 Genetic carrier of other disease: Secondary | ICD-10-CM

## 2019-08-11 DIAGNOSIS — Z3A37 37 weeks gestation of pregnancy: Secondary | ICD-10-CM

## 2019-08-11 DIAGNOSIS — Z3400 Encounter for supervision of normal first pregnancy, unspecified trimester: Secondary | ICD-10-CM

## 2019-08-11 NOTE — Progress Notes (Signed)
   PRENATAL VISIT NOTE  Subjective:  Christina Reeves is a 24 y.o. G1P0 at [redacted]w[redacted]d being seen today for ongoing prenatal care.  She is currently monitored for the following issues for this low-risk pregnancy and has Supervision of normal first pregnancy, antepartum; Patient underweight; Lactose intolerance; and Genetic carrier status on their problem list.  Patient reports no complaints.  Contractions: Not present. Vag. Bleeding: None.  Movement: Present. Denies leaking of fluid.   The following portions of the patient's history were reviewed and updated as appropriate: allergies, current medications, past family history, past medical history, past social history, past surgical history and problem list.   Objective:   Vitals:   08/11/19 0850  BP: 119/74  Pulse: 79  Weight: 132 lb (59.9 kg)    Fetal Status: Fetal Heart Rate (bpm): 142 Fundal Height: 37 cm Movement: Present  Presentation: Vertex  General:  Alert, oriented and cooperative. Patient is in no acute distress.  Skin: Skin is warm and dry. No rash noted.   Cardiovascular: Normal heart rate noted  Respiratory: Normal respiratory effort, no problems with respiration noted  Abdomen: Soft, gravid, appropriate for gestational age.  Pain/Pressure: Present     Pelvic: Cervical exam deferred        Extremities: Normal range of motion.  Edema: Trace  Mental Status: Normal mood and affect. Normal behavior. Normal judgment and thought content.   Assessment and Plan:  Pregnancy: G1P0 at [redacted]w[redacted]d  1. Supervision of normal first pregnancy, antepartum FHT and FH normal  2. Patient underweight Good weight gain  3. Genetic carrier status   Preterm labor symptoms and general obstetric precautions including but not limited to vaginal bleeding, contractions, leaking of fluid and fetal movement were reviewed in detail with the patient. Please refer to After Visit Summary for other counseling recommendations.   No follow-ups on  file.  Future Appointments  Date Time Provider Department Center  08/18/2019  8:45 AM Levie Heritage, DO [facility] None  08/26/2019  8:15 AM Anyanwu, Jethro Bastos, MD [facility] None    Levie Heritage, DO

## 2019-08-18 ENCOUNTER — Other Ambulatory Visit: Payer: Self-pay

## 2019-08-18 ENCOUNTER — Ambulatory Visit (INDEPENDENT_AMBULATORY_CARE_PROVIDER_SITE_OTHER): Payer: Managed Care, Other (non HMO) | Admitting: Family Medicine

## 2019-08-18 VITALS — BP 109/79 | HR 75 | Wt 133.0 lb

## 2019-08-18 DIAGNOSIS — Z3400 Encounter for supervision of normal first pregnancy, unspecified trimester: Secondary | ICD-10-CM

## 2019-08-18 DIAGNOSIS — R636 Underweight: Secondary | ICD-10-CM

## 2019-08-18 DIAGNOSIS — Z148 Genetic carrier of other disease: Secondary | ICD-10-CM

## 2019-08-18 NOTE — Progress Notes (Addendum)
   PRENATAL VISIT NOTE  Subjective:  Christina Reeves is a 24 y.o. G1P0 at [redacted]w[redacted]d being seen today for ongoing prenatal care.  She is currently monitored for the following issues for this low-risk pregnancy and has Supervision of normal first pregnancy, antepartum; Patient underweight; Lactose intolerance; and Genetic carrier status on their problem list.  Patient reports occasional contractions.  Contractions: Not present. Vag. Bleeding: None.  Movement: Present. Denies leaking of fluid.   The following portions of the patient's history were reviewed and updated as appropriate: allergies, current medications, past family history, past medical history, past social history, past surgical history and problem list.   Objective:   Vitals:   08/18/19 0857  BP: 109/79  Pulse: 75  Weight: 133 lb (60.3 kg)    Fetal Status: Fetal Heart Rate (bpm): 132   Movement: Present     General:  Alert, oriented and cooperative. Patient is in no acute distress.  Skin: Skin is warm and dry. No rash noted.   Cardiovascular: Normal heart rate noted  Respiratory: Normal respiratory effort, no problems with respiration noted  Abdomen: Soft, gravid, appropriate for gestational age.  Pain/Pressure: Absent     Pelvic: Cervical exam deferred        Extremities: Normal range of motion.  Edema: Trace  Mental Status: Normal mood and affect. Normal behavior. Normal judgment and thought content.   Assessment and Plan:  Pregnancy: G1P0 at [redacted]w[redacted]d 1. Supervision of normal first pregnancy, antepartum FHT and FH normal  2. Patient underweight 27# weight gain  3. Genetic carrier status    Term labor symptoms and general obstetric precautions including but not limited to vaginal bleeding, contractions, leaking of fluid and fetal movement were reviewed in detail with the patient. Please refer to After Visit Summary for other counseling recommendations.   Return in about 1 week (around 08/25/2019) for OB f/u.  Future  Appointments  Date Time Provider Department Center  08/26/2019  8:15 AM Anyanwu, Jethro Bastos, MD [facility] None    Levie Heritage, DO

## 2019-08-26 ENCOUNTER — Other Ambulatory Visit: Payer: Self-pay

## 2019-08-26 ENCOUNTER — Ambulatory Visit (INDEPENDENT_AMBULATORY_CARE_PROVIDER_SITE_OTHER): Payer: Medicaid Other | Admitting: Obstetrics & Gynecology

## 2019-08-26 DIAGNOSIS — Z3400 Encounter for supervision of normal first pregnancy, unspecified trimester: Secondary | ICD-10-CM

## 2019-08-26 NOTE — Patient Instructions (Addendum)
Call the office or go to the MAU at [place] at [facility] if:  You begin to have strong, frequent contractions  Your water breaks.  Sometimes it is a big gush of fluid, sometimes it is just a trickle that keeps getting your panties wet or running down your legs  You have vaginal bleeding.  It is normal to have a small amount of spotting if your cervix was checked.   You do not feel your baby moving like normal.  If you do not, get something to eat and drink and lay down and focus on feeling your baby move.   If your baby is still not moving like normal, you should call the office or go to MAU.  Any other obstetric concerns.   Cervical Ripening: May try one or both  Red Raspberry Leaf capsules:  two 300mg  or 400mg  tablets with each meal, 2-3 times a day  Potential Side Effects Of Raspberry Leaf:  Most women do not experience any side effects from drinking raspberry leaf tea. However, nausea and loose stools are possible   Evening Primrose Oil capsules: may take 1 to 3 capsules daily. May also prick one to release the oil and insert it into your vagina at night.  Some of the potential side effects:  Upset stomach  Loose stools or diarrhea  Headaches  Nausea  Also walking, intercourse and nipple stimulation recommended.    OUTPATIENT FOLEY BULB INDUCTION OF LABOR (another option for cervical ripening):  Information Sheet for Mothers and Family               What's a Foley Bulb Induction? A Foley bulb induction is a procedure where your provider inserts a catheter into your cervix. Once inside your womb, your provider inflates the balloon with a saline solution.   This puts pressure on your cervix and encourages dilation. The catheter falls out once your cervix dilates to 3-4 centimeters.     With any procedure, it's important that you know what to expect. The insertion of a Foley catheter can be a bit uncomfortable, and some women experience sharp pelvic pain.  The pain may subside once the catheter is in place. You may experience some cramping when the Foley catheter is in place.  This is normal.     GO TO THE MATERNITY ADMISSIONS UNIT FOR THE FOLLOWING:  Heavy vaginal bleeding  Rupture of membranes (fluid that wets your underwear)  Painful uterine contractions every 5 minutes or less  Severe abdominal discomfort  Decreased movement of the baby     Labor Induction  Labor induction is when steps are taken to cause a pregnant woman to begin the labor process. Most women go into labor on their own between 37 weeks and 42 weeks of pregnancy. When this does not happen or when there is a medical need for labor to begin, steps may be taken to induce labor. Labor induction causes a pregnant woman's uterus to contract. It also causes the cervix to soften (ripen), open (dilate), and thin out (efface). Usually, labor is not induced before 39 weeks of pregnancy unless there is a medical reason to do so. Your health care provider will determine if labor induction is needed. Before inducing labor, your health care provider will consider a number of factors, including:  Your medical condition and your baby's.  How many weeks along you are in your pregnancy.  How mature your baby's lungs are.  The condition of your cervix.  The position of your baby.  The size of your birth canal. What are some reasons for labor induction? Labor may be induced if:  Your health or your baby's health is at risk.  Your pregnancy is overdue by 1 week or more.  Your water breaks but labor does not start on its own.  There is a low amount of amniotic fluid around your baby. You may also choose (elect) to have labor induced at a certain time. Generally, elective labor induction is done no earlier than 39 weeks of pregnancy. What methods are used for labor induction? Methods used for labor induction include:  Prostaglandin medicine. This medicine starts contractions  and causes the cervix to dilate and ripen. It can be taken by mouth (orally) or by being inserted into the vagina (suppository).  Inserting a small, thin tube (catheter) with a balloon into the vagina and then expanding the balloon with water to dilate the cervix.  Stripping the membranes. In this method, your health care provider gently separates amniotic sac tissue from the cervix. This causes the cervix to stretch, which in turn causes the release of a hormone called progesterone. The hormone causes the uterus to contract. This procedure is often done during an office visit, after which you will be sent home to wait for contractions to begin.  Breaking the water. In this method, your health care provider uses a small instrument to make a small hole in the amniotic sac. This eventually causes the amniotic sac to break. Contractions should begin after a few hours.  Medicine to trigger or strengthen contractions. This medicine is given through an IV that is inserted into a vein in your arm. Except for membrane stripping, which can be done in a clinic, labor induction is done in the hospital so that you and your baby can be carefully monitored. How long does it take for labor to be induced? The length of time it takes to induce labor depends on how ready your body is for labor. Some inductions can take up to 2-3 days, while others may take less than a day. Induction may take longer if:  You are induced early in your pregnancy.  It is your first pregnancy.  Your cervix is not ready. What are some risks associated with labor induction? Some risks associated with labor induction include:  Changes in fetal heart rate, such as being too high, too low, or irregular (erratic).  Failed induction.  Infection in the mother or the baby.  Increased risk of having a cesarean delivery.  Fetal death.  Breaking off (abruption) of the placenta from the uterus (rare).  Rupture of the uterus (very  rare). When induction is needed for medical reasons, the benefits of induction generally outweigh the risks. What are some reasons for not inducing labor? Labor induction should not be done if:  Your baby does not tolerate contractions.  You have had previous surgeries on your uterus, such as a myomectomy, removal of fibroids, or a vertical scar from a previous cesarean delivery.  Your placenta lies very low in your uterus and blocks the opening of the cervix (placenta previa).  Your baby is not in a head-down position.  The umbilical cord drops down into the birth canal in front of the baby.  There are unusual circumstances, such as the baby being very early (premature).  You have had more than 2 previous cesarean deliveries. Summary  Labor induction is when steps are taken to cause a pregnant woman to  begin the labor process.  Labor induction causes a pregnant woman's uterus to contract. It also causes the cervix to ripen, dilate, and efface.  Labor is not induced before 39 weeks of pregnancy unless there is a medical reason to do so.  When induction is needed for medical reasons, the benefits of induction generally outweigh the risks. This information is not intended to replace advice given to you by your health care provider. Make sure you discuss any questions you have with your health care provider. Document Revised: 04/10/2017 Document Reviewed: 05/21/2016 Elsevier Patient Education  2020 [employer].

## 2019-08-26 NOTE — Progress Notes (Signed)
   PRENATAL VISIT NOTE  Subjective:  Christina Reeves is a 24 y.o. G1P0 at [redacted]w[redacted]d being seen today for ongoing prenatal care.  She is currently monitored for the following issues for this low-risk pregnancy and has Supervision of normal first pregnancy, antepartum; Patient underweight; Lactose intolerance; and Genetic carrier status on their problem list.  Patient reports occasional contractions.  Contractions: Irritability. Vag. Bleeding: None.  Movement: Present. Denies leaking of fluid. Desires induction of labor at her due date.  The following portions of the patient's history were reviewed and updated as appropriate: allergies, current medications, past family history, past medical history, past social history, past surgical history and problem list.   Objective:   Vitals:   08/26/19 0814  BP: 99/64  Pulse: 90  Weight: 133 lb (60.3 kg)    Fetal Status: Fetal Heart Rate (bpm): 140 Fundal Height: 39 cm Movement: Present  Presentation: Vertex  General:  Alert, oriented and cooperative. Patient is in no acute distress.  Skin: Skin is warm and dry. No rash noted.   Cardiovascular: Normal heart rate noted  Respiratory: Normal respiratory effort, no problems with respiration noted  Abdomen: Soft, gravid, appropriate for gestational age.  Pain/Pressure: Present     Pelvic: Cervical exam performed in the presence of a chaperone Dilation: Closed Effacement (%): 50 Station: Ballotable  Extremities: Normal range of motion.  Edema: Trace  Mental Status: Normal mood and affect. Normal behavior. Normal judgment and thought content.   Assessment and Plan:  Pregnancy: G1P0 at [redacted]w[redacted]d 1. Supervision of normal first pregnancy, antepartum Discussed IOL at 40 weeks, she was told she can go up to 41 weeks to maximize chances of spontaneous labor, also talked about need for post term BPP.  She still desired IOL at 40 weeks.  This was scheduled, orders placed. Offered outpatient foley placement for cervical  ripening, she will consider this, information given to her to review at home. She was told to expect a call from [facility] L&D with further instructions about her per-admission COVID screening and any further instructions about the induction of labor. She was told that the only scheduled time slot was midnight, patients are called in during the morning between 6:30am -9 am to come in for their induction as soon as their rooms and staff are ready for them.  Preterm labor symptoms and general obstetric precautions including but not limited to vaginal bleeding, contractions, leaking of fluid and fetal movement were reviewed in detail with the patient. Please refer to After Visit Summary for other counseling recommendations.   Return in about 5 weeks (around 09/30/2019) for Postpartum check.  Future Appointments  Date Time Provider Department Center  08/31/2019  7:00 AM [facility] SCHED ROOM [facility] None  09/30/2019 10:00 AM Levie Heritage, DO [facility] None    Jaynie Collins, MD

## 2019-08-29 ENCOUNTER — Encounter (HOSPITAL_COMMUNITY): Payer: Self-pay

## 2019-08-29 ENCOUNTER — Other Ambulatory Visit (HOSPITAL_COMMUNITY)
Admission: RE | Admit: 2019-08-29 | Discharge: 2019-08-29 | Disposition: A | Discharge status: Home / Self Care | Payer: Medicaid Other | Source: Ambulatory Visit | Attending: Family Medicine | Admitting: Family Medicine

## 2019-08-29 ENCOUNTER — Telehealth (HOSPITAL_COMMUNITY): Payer: Self-pay | Admitting: *Deleted

## 2019-08-29 ENCOUNTER — Other Ambulatory Visit: Payer: Self-pay | Admitting: Family Medicine

## 2019-08-29 DIAGNOSIS — Z20822 Contact with and (suspected) exposure to covid-19: Secondary | ICD-10-CM | POA: Insufficient documentation

## 2019-08-29 DIAGNOSIS — Z01812 Encounter for preprocedural laboratory examination: Secondary | ICD-10-CM | POA: Insufficient documentation

## 2019-08-29 LAB — SARS CORONAVIRUS 2 (TAT 6-24 HRS): SARS Coronavirus 2: NEGATIVE

## 2019-08-29 NOTE — Telephone Encounter (Signed)
Preadmission screen  

## 2019-08-31 ENCOUNTER — Other Ambulatory Visit: Payer: Self-pay

## 2019-08-31 ENCOUNTER — Encounter (HOSPITAL_COMMUNITY): Payer: Self-pay | Admitting: Obstetrics & Gynecology

## 2019-08-31 ENCOUNTER — Inpatient Hospital Stay (HOSPITAL_COMMUNITY): Payer: Medicaid Other | Admitting: Anesthesiology

## 2019-08-31 ENCOUNTER — Inpatient Hospital Stay (HOSPITAL_COMMUNITY)
Admission: RE | Admit: 2019-08-31 | Discharge: 2019-09-02 | DRG: 807 | Disposition: A | Discharge status: Home / Self Care | Payer: Medicaid Other | Attending: Family Medicine | Admitting: Family Medicine

## 2019-08-31 ENCOUNTER — Inpatient Hospital Stay (HOSPITAL_COMMUNITY): Payer: Medicaid Other

## 2019-08-31 DIAGNOSIS — R636 Underweight: Secondary | ICD-10-CM | POA: Diagnosis present

## 2019-08-31 DIAGNOSIS — Z3A40 40 weeks gestation of pregnancy: Secondary | ICD-10-CM | POA: Diagnosis not present

## 2019-08-31 DIAGNOSIS — O480 Post-term pregnancy: Secondary | ICD-10-CM | POA: Diagnosis present

## 2019-08-31 DIAGNOSIS — Z3490 Encounter for supervision of normal pregnancy, unspecified, unspecified trimester: Secondary | ICD-10-CM | POA: Diagnosis present

## 2019-08-31 DIAGNOSIS — Z148 Genetic carrier of other disease: Secondary | ICD-10-CM

## 2019-08-31 DIAGNOSIS — Z3400 Encounter for supervision of normal first pregnancy, unspecified trimester: Secondary | ICD-10-CM

## 2019-08-31 DIAGNOSIS — O26893 Other specified pregnancy related conditions, third trimester: Secondary | ICD-10-CM | POA: Diagnosis present

## 2019-08-31 LAB — CBC
HCT: 40.1 % (ref 36.0–46.0)
Hemoglobin: 13.3 g/dL (ref 12.0–15.0)
MCH: 26.3 pg (ref 26.0–34.0)
MCHC: 33.2 g/dL (ref 30.0–36.0)
MCV: 79.4 fL — ABNORMAL LOW (ref 80.0–100.0)
Platelets: 177 10*3/uL (ref 150–400)
RBC: 5.05 MIL/uL (ref 3.87–5.11)
RDW: 14.4 % (ref 11.5–15.5)
WBC: 5.9 10*3/uL (ref 4.0–10.5)
nRBC: 0 % (ref 0.0–0.2)

## 2019-08-31 LAB — ABO/RH: ABO/RH(D): O POS

## 2019-08-31 LAB — TYPE AND SCREEN
ABO/RH(D): O POS
Antibody Screen: NEGATIVE

## 2019-08-31 LAB — RPR: RPR Ser Ql: NONREACTIVE

## 2019-08-31 MED ORDER — LACTATED RINGERS IV SOLN: INTRAVENOUS | Status: DC

## 2019-08-31 MED ORDER — IBUPROFEN 600 MG PO TABS
600.0000 mg | ORAL_TABLET | Freq: Four times a day (QID) | ORAL | Status: DC
  Administered 2019-09-01 – 2019-09-02 (×7): 600 mg via ORAL
  Filled 2019-08-31 (×7): qty 1

## 2019-08-31 MED ORDER — ONDANSETRON HCL 4 MG/2ML IJ SOLN: 4.0000 mg | INTRAMUSCULAR | Status: DC | PRN

## 2019-08-31 MED ORDER — HYDROXYZINE HCL 50 MG PO TABS: 50.0000 mg | ORAL_TABLET | Freq: Four times a day (QID) | ORAL | Status: DC | PRN

## 2019-08-31 MED ORDER — OXYCODONE-ACETAMINOPHEN 5-325 MG PO TABS: 2.0000 | ORAL_TABLET | ORAL | Status: DC | PRN

## 2019-08-31 MED ORDER — COCONUT OIL OIL: 1.0000 "application " | TOPICAL_OIL | Status: DC | PRN

## 2019-08-31 MED ORDER — OXYCODONE-ACETAMINOPHEN 5-325 MG PO TABS: 1.0000 | ORAL_TABLET | ORAL | Status: DC | PRN

## 2019-08-31 MED ORDER — OXYTOCIN 40 UNITS IN NORMAL SALINE INFUSION - SIMPLE MED
2.5000 [IU]/h | INTRAVENOUS | Status: DC
  Administered 2019-08-31: 2.5 [IU]/h via INTRAVENOUS

## 2019-08-31 MED ORDER — EPHEDRINE 5 MG/ML INJ: 10.0000 mg | INTRAVENOUS | Status: DC | PRN

## 2019-08-31 MED ORDER — SOD CITRATE-CITRIC ACID 500-334 MG/5ML PO SOLN
30.0000 mL | ORAL | Status: DC | PRN
  Administered 2019-08-31: 30 mL via ORAL
  Filled 2019-08-31: qty 30

## 2019-08-31 MED ORDER — LACTATED RINGERS IV SOLN: 500.0000 mL | Freq: Once | INTRAVENOUS | Status: DC

## 2019-08-31 MED ORDER — FENTANYL-BUPIVACAINE-NACL 0.5-0.125-0.9 MG/250ML-% EP SOLN
12.0000 mL/h | EPIDURAL | Status: DC | PRN
  Filled 2019-08-31: qty 250

## 2019-08-31 MED ORDER — OXYCODONE HCL 5 MG PO TABS: 5.0000 mg | ORAL_TABLET | ORAL | Status: DC | PRN

## 2019-08-31 MED ORDER — ACETAMINOPHEN 325 MG PO TABS: 650.0000 mg | ORAL_TABLET | ORAL | Status: DC | PRN

## 2019-08-31 MED ORDER — OXYTOCIN 40 UNITS IN NORMAL SALINE INFUSION - SIMPLE MED
1.0000 m[IU]/min | INTRAVENOUS | Status: DC
  Administered 2019-08-31: 2 m[IU]/min via INTRAVENOUS
  Filled 2019-08-31: qty 1000

## 2019-08-31 MED ORDER — EPHEDRINE 5 MG/ML INJ
10.0000 mg | INTRAVENOUS | Status: DC | PRN
  Filled 2019-08-31: qty 10

## 2019-08-31 MED ORDER — SIMETHICONE 80 MG PO CHEW: 80.0000 mg | CHEWABLE_TABLET | ORAL | Status: DC | PRN

## 2019-08-31 MED ORDER — LACTATED RINGERS IV SOLN: 500.0000 mL | INTRAVENOUS | Status: DC | PRN

## 2019-08-31 MED ORDER — ZOLPIDEM TARTRATE 5 MG PO TABS: 5.0000 mg | ORAL_TABLET | Freq: Every evening | ORAL | Status: DC | PRN

## 2019-08-31 MED ORDER — MISOPROSTOL 25 MCG QUARTER TABLET
25.0000 ug | ORAL_TABLET | ORAL | Status: DC | PRN
  Administered 2019-08-31: 25 ug via VAGINAL
  Filled 2019-08-31: qty 1

## 2019-08-31 MED ORDER — FENTANYL CITRATE (PF) 100 MCG/2ML IJ SOLN
50.0000 ug | INTRAMUSCULAR | Status: DC | PRN
  Administered 2019-08-31 (×2): 100 ug via INTRAVENOUS
  Administered 2019-08-31: 50 ug via INTRAVENOUS
  Filled 2019-08-31 (×3): qty 2

## 2019-08-31 MED ORDER — OXYTOCIN BOLUS FROM INFUSION
500.0000 mL | Freq: Once | INTRAVENOUS | Status: AC
  Administered 2019-08-31: 500 mL/h via INTRAVENOUS

## 2019-08-31 MED ORDER — LIDOCAINE HCL (PF) 1 % IJ SOLN
INTRAMUSCULAR | Status: DC | PRN
  Administered 2019-08-31: 6 mL via EPIDURAL

## 2019-08-31 MED ORDER — DIPHENHYDRAMINE HCL 50 MG/ML IJ SOLN: 12.5000 mg | INTRAMUSCULAR | Status: DC | PRN

## 2019-08-31 MED ORDER — TETANUS-DIPHTH-ACELL PERTUSSIS 5-2.5-18.5 LF-MCG/0.5 IM SUSP: 0.5000 mL | Freq: Once | INTRAMUSCULAR | Status: DC

## 2019-08-31 MED ORDER — ONDANSETRON HCL 4 MG PO TABS: 4.0000 mg | ORAL_TABLET | ORAL | Status: DC | PRN

## 2019-08-31 MED ORDER — DIBUCAINE (PERIANAL) 1 % EX OINT: 1.0000 "application " | TOPICAL_OINTMENT | CUTANEOUS | Status: DC | PRN

## 2019-08-31 MED ORDER — PRENATAL MULTIVITAMIN CH
1.0000 | ORAL_TABLET | Freq: Every day | ORAL | Status: DC
  Administered 2019-09-01 – 2019-09-02 (×2): 1 via ORAL
  Filled 2019-08-31 (×2): qty 1

## 2019-08-31 MED ORDER — BENZOCAINE-MENTHOL 20-0.5 % EX AERO
1.0000 "application " | INHALATION_SPRAY | CUTANEOUS | Status: DC | PRN
  Filled 2019-08-31: qty 56

## 2019-08-31 MED ORDER — WITCH HAZEL-GLYCERIN EX PADS: 1.0000 "application " | MEDICATED_PAD | CUTANEOUS | Status: DC | PRN

## 2019-08-31 MED ORDER — LIDOCAINE HCL (PF) 1 % IJ SOLN: 30.0000 mL | INTRAMUSCULAR | Status: DC | PRN

## 2019-08-31 MED ORDER — MEASLES, MUMPS & RUBELLA VAC IJ SOLR: 0.5000 mL | Freq: Once | INTRAMUSCULAR | Status: DC

## 2019-08-31 MED ORDER — SENNOSIDES-DOCUSATE SODIUM 8.6-50 MG PO TABS
2.0000 | ORAL_TABLET | ORAL | Status: DC
  Administered 2019-09-01: 2 via ORAL
  Filled 2019-08-31 (×2): qty 2

## 2019-08-31 MED ORDER — SODIUM CHLORIDE 0.9% FLUSH: 10.0000 mL | INTRAVENOUS | Status: DC | PRN

## 2019-08-31 MED ORDER — SODIUM CHLORIDE (PF) 0.9 % IJ SOLN
INTRAMUSCULAR | Status: DC | PRN
  Administered 2019-08-31: 12 mL/h via EPIDURAL

## 2019-08-31 MED ORDER — PHENYLEPHRINE 40 MCG/ML (10ML) SYRINGE FOR IV PUSH (FOR BLOOD PRESSURE SUPPORT): 80.0000 ug | PREFILLED_SYRINGE | INTRAVENOUS | Status: DC | PRN

## 2019-08-31 MED ORDER — ONDANSETRON HCL 4 MG/2ML IJ SOLN
4.0000 mg | Freq: Four times a day (QID) | INTRAMUSCULAR | Status: DC | PRN
  Administered 2019-08-31 (×2): 4 mg via INTRAVENOUS
  Filled 2019-08-31 (×2): qty 2

## 2019-08-31 MED ORDER — FENTANYL-BUPIVACAINE-NACL 0.5-0.125-0.9 MG/250ML-% EP SOLN: 12.0000 mL/h | EPIDURAL | Status: DC | PRN

## 2019-08-31 MED ORDER — TERBUTALINE SULFATE 1 MG/ML IJ SOLN: 0.2500 mg | Freq: Once | INTRAMUSCULAR | Status: DC | PRN

## 2019-08-31 MED ORDER — DIPHENHYDRAMINE HCL 25 MG PO CAPS: 25.0000 mg | ORAL_CAPSULE | Freq: Four times a day (QID) | ORAL | Status: DC | PRN

## 2019-08-31 MED ORDER — PROMETHAZINE HCL 25 MG/ML IJ SOLN
12.5000 mg | Freq: Four times a day (QID) | INTRAMUSCULAR | Status: DC | PRN
  Administered 2019-08-31: 12.5 mg via INTRAVENOUS
  Filled 2019-08-31: qty 1

## 2019-08-31 MED ORDER — FLEET ENEMA 7-19 GM/118ML RE ENEM: 1.0000 | ENEMA | Freq: Every day | RECTAL | Status: DC | PRN

## 2019-08-31 NOTE — Discharge Summary (Addendum)
Postpartum Discharge Summary     Patient Name: Christina Reeves DOB: 1995-08-18 MRN: [id number]  Date of admission: 08/31/2019 Delivery date:08/31/2019  Delivering provider: Serita Grammes D  Date of discharge: 09/02/2019  Admitting diagnosis: Post term pregnancy over 40 weeks [O48.0] Intrauterine pregnancy: [redacted]w[redacted]d    Secondary diagnosis:  Active Problems:   Patient underweight   Genetic carrier status   Encounter for elective induction of labor  Additional problems: none    Discharge diagnosis: Term Pregnancy Delivered                                              Post partum procedures:none Augmentation: AROM, Pitocin, Cytotec and IP Foley Complications: None  Hospital course: Induction of Labor With Vaginal Delivery   24y.o. yo G1P0 at 467w0das admitted to the hospital 08/31/2019 for induction of labor.  Indication for induction: Elective.  Patient had an uncomplicated labor course, including the usual cervical ripening methods, and progressing to vag del <12h after IOL began. Membrane Rupture Time/Date: 6:12 PM ,08/31/2019   Delivery Method:Vaginal, Spontaneous  Episiotomy: None  Lacerations:  None  Details of delivery can be found in separate delivery note.  Patient had a routine postpartum course. Patient is discharged home 09/02/19.  Newborn Data: Birth date:08/31/2019  Birth time:7:07 PM  Gender:Female  Living status:Living  Apgars:8 ,9   Weight: 2724gm (6lb 0.1oz)  Magnesium Sulfate received: No BMZ received: No Rhophylac:N/A MMR:N/A T-DaP:Given prenatally Flu: Yes Transfusion:No  Physical exam  Vitals:   09/01/19 0500 09/01/19 1255 09/01/19 2153 09/02/19 0505  BP: 102/70 116/69 103/69 109/61  Pulse: 63 75 74 71  Resp: '16 16 18 18  '$ Temp: 98.7 F (37.1 C) 98.6 F (37 C) 98.5 F (36.9 C) 98.2 F (36.8 C)  TempSrc: Oral Oral Oral Oral  SpO2: 100%  100% 100%  Weight:      Height:       General: alert, cooperative and no distress Lochia:  appropriate Uterine Fundus: firm Incision: N/A DVT Evaluation: No evidence of DVT seen on physical exam. Labs: Lab Results  Component Value Date   WBC 5.9 08/31/2019   HGB 13.3 08/31/2019   HCT 40.1 08/31/2019   MCV 79.4 (L) 08/31/2019   PLT 177 08/31/2019   No flowsheet data found. Edinburgh Score: Edinburgh Postnatal Depression Scale Screening Tool 09/01/2019  I have been able to laugh and see the funny side of things. 0  I have looked forward with enjoyment to things. 0  I have blamed myself unnecessarily when things went wrong. 1  I have been anxious or worried for no good reason. 2  I have felt scared or panicky for no good reason. 1  Things have been getting on top of me. 2  I have been so unhappy that I have had difficulty sleeping. 2  I have felt sad or miserable. 1  I have been so unhappy that I have been crying. 1  The thought of harming myself has occurred to me. 0  Edinburgh Postnatal Depression Scale Total 10     After visit meds:  Allergies as of 09/02/2019   No Known Allergies     Medication List    STOP taking these medications   AMBULATORY NON FORMULARY MEDICATION   Doxylamine-Pyridoxine 10-10 MG Tbec     TAKE these medications   acetaminophen 500  MG tablet Commonly known as: TYLENOL Take 500 mg by mouth every 6 (six) hours as needed for mild pain or headache.   famotidine 20 MG tablet Commonly known as: PEPCID Take 1 tablet by mouth twice daily   ibuprofen 600 MG tablet Commonly known as: ADVIL Take 1 tablet (600 mg total) by mouth every 6 (six) hours.   PrePLUS 27-1 MG Tabs Take 1 tablet by mouth daily.        Discharge home in stable condition Infant Feeding: Breast Infant Disposition:rooming in Discharge instruction: per After Visit Summary and Postpartum booklet. Activity: Advance as tolerated. Pelvic rest for 6 weeks.  Diet: routine diet Future Appointments: Future Appointments  Date Time Provider [place]   09/30/2019 10:00 AM [name], DO [facility] None   Follow up Visit: Follow-up Information    [facility] Follow up.   Contact information: [place] [phone]          Please schedule this patient for Postpartum visit in: 4 weeks with the following provider: Any provider Pt preference For C/S patients schedule nurse incision check in weeks 2 weeks: no Low risk pregnancy complicated by: none Delivery mode:  SVD Anticipated Birth Control:  other/unsure PP Procedures needed: none  Schedule Integrated BH visit: no   09/02/2019 Lyndee Hensen, DO

## 2019-08-31 NOTE — Progress Notes (Signed)
Christina Reeves is a 24 y.o. G1P0 at [redacted]w[redacted]d.  Subjective: Uncomfortable, but coping well after Fentanyl.   Objective: BP 127/83   Pulse 73   Temp 97.6 F (36.4 C) (Axillary)   Resp 16   Ht 5\' 5"  (1.651 m)   Wt 60.3 kg   LMP 11/29/2018   BMI 22.13 kg/m    FHT:  FHR: 115 bpm, variability: mod,  accelerations:  15x15,  decelerations:  none UC:   Q 2-3 minutes, mild-mod Dilation: 4 Effacement (%): 70 Cervical Position: Middle Station: -1 Presentation: Vertex Exam by:: Marilee Ditommaso cnm  Labs: Results for orders placed or performed during the hospital encounter of 08/31/19 (from the past 24 hour(s))  CBC     Status: Abnormal   Collection Time: 08/31/19  8:51 AM  Result Value Ref Range   WBC 5.9 4.0 - 10.5 K/uL   RBC 5.05 3.87 - 5.11 MIL/uL   Hemoglobin 13.3 12.0 - 15.0 g/dL   HCT 10/31/19 14.4 - 31.5 %   MCV 79.4 (L) 80.0 - 100.0 fL   MCH 26.3 26.0 - 34.0 pg   MCHC 33.2 30.0 - 36.0 g/dL   RDW 40.0 86.7 - 61.9 %   Platelets 177 150 - 400 K/uL   nRBC 0.0 0.0 - 0.2 %  RPR     Status: None   Collection Time: 08/31/19  8:51 AM  Result Value Ref Range   RPR Ser Ql NON REACTIVE NON REACTIVE  Type and screen     Status: None   Collection Time: 08/31/19  8:51 AM  Result Value Ref Range   ABO/RH(D) O POS    Antibody Screen NEG    Sample Expiration      09/03/2019,2359 Performed at [facility], [address] [place], [place]   ABO/Rh     Status: None   Collection Time: 08/31/19  8:51 AM  Result Value Ref Range   ABO/RH(D)      O POS Performed at [facility], [address], [place], [place]     Assessment / Plan: [redacted]w[redacted]d week IUP Labor: Early Fetal Wellbeing:  Category I Pain Control:  Fentanyl Anticipated MOD:  SVD Start pitocin Epidural PRN  [redacted]w[redacted]d, Katrinka Blazing, CNM 08/31/2019 2:18 PM

## 2019-08-31 NOTE — H&P (Addendum)
OBSTETRIC ADMISSION HISTORY AND PHYSICAL  Christina Reeves is a 24 y.o. female G47P0 with IUP at 93w0dby [place] elective [place]. She reports +FMs, No LOF, no VB, no blurry vision, headaches or peripheral edema, and RUQ pain.  She plans on breast and bottle feeding. She is undecided for birth control. 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 FOB untested  TDaP vaccine  05/27/19 Hgb A1C or  GTT Early  Third trimester: normal 2hr  Rhogam  NA   LAB RESULTS   Feeding Plan Will try breast Blood Type O/Positive/-- (10/29 1401)   Contraception  undecided, methods rev'd Antibody Negative (10/29 1401)  Circumcision  Girl Rubella 3.15 (10/29 1401)  Pediatrician   RPR Non Reactive (10/29 1401)   Support Person Lawrence(FOB) HBsAg Negative (10/29 1401)   Prenatal Classes  HIV Non Reactive (10/29 1401)  BTL Consent NA GBS NEG  VBAC Consent NA Pap  02-17-2019 WNL    Hgb Electro     BP Cuff Received on  05/27/19 CF     SMA     Waterbirth  '[ ]'$  Class '[ ]'$  Consent '[ ]'$  CNM visit    Prenatal History/Complications:  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 No pertinent past medical history.  Past Surgical History: Past Surgical History:  Procedure Laterality Date  . NO PAST SURGERIES      Obstetrical History: OB History    Gravida  1   Para      Term      Preterm      AB      Living        SAB      TAB      Ectopic      Multiple      Live Births              Social History Social History   Socioeconomic History  . Marital status: Single     Spouse name: Not on file  . Number of children: Not on file  . Years of education: Not on file  . Highest education level: Not on file  Occupational History  . Not on file  Tobacco Use  . Smoking status: Never Smoker  . Smokeless tobacco: Never Used  Substance and Sexual Activity  . Alcohol use: Not Currently    Comment: on weekends  . Drug use: Never  . Sexual activity: Yes  Other Topics Concern  . Not on file  Social History Narrative  . Not on file   Social Determinants of Health   Financial Resource Strain:   . Difficulty of Paying Living Expenses:   Food Insecurity:   . Worried About RCharity fundraiserin the Last Year:   . RArboriculturistin the Last Year:   Transportation Needs:   . LFilm/video editor(Medical):   .Marland KitchenLack of Transportation (Non-Medical):   Physical Activity:   . Days of Exercise per Week:   .  Minutes of Exercise per Session:   Stress:   . Feeling of Stress :   Social Connections:   . Frequency of Communication with Friends and Family:   . Frequency of Social Gatherings with Friends and Family:   . Attends Religious Services:   . Active Member of Clubs or Organizations:   . Attends Archivist Meetings:   Marland Kitchen Marital Status:     Family History: Family History  Problem Relation Age of Onset  . Cancer Paternal Grandfather   . Cancer Paternal Grandmother        breast  . Breast cancer Paternal Grandmother   . Hypertension Mother   . Diabetes Brother     Allergies: No Known Allergies  Medications Prior to Admission  Medication Sig Dispense Refill Last Dose  . acetaminophen (TYLENOL) 500 MG tablet Take 500 mg by mouth every 6 (six) hours as needed for mild pain or headache.   08/27/2019  . famotidine (PEPCID) 20 MG tablet Take 1 tablet by mouth twice daily 60 tablet 0 08/31/2019 at Unknown time  . Prenatal Vit-Fe Fumarate-FA (PREPLUS) 27-1 MG TABS Take 1 tablet by mouth daily. 90 tablet 3 08/30/2019 at Unknown time  .  AMBULATORY NON FORMULARY MEDICATION 1 Device by Other route once a week. Blood pressure cuff/Medium Monitored Regularly at home  ICD 10: Z34.90 LROB 1 kit 0   . Doxylamine-Pyridoxine 10-10 MG TBEC Take 2 tablets at bedtime. If symptoms persist add 1 tab in the morning and 1 tab in the afternoon. (Patient not taking: Reported on 08/31/2019) 60 tablet 1 Not Taking at Unknown time     Review of Systems   All systems reviewed and negative except as stated in HPI  Blood pressure 114/72, pulse 72, temperature 97.9 F (36.6 C), temperature source Oral, resp. rate 18, height '5\' 5"'$  (1.651 m), weight 60.3 kg, last menstrual period 11/29/2018. General appearance: alert and no distress Lungs: clear to auscultation bilaterally Heart: regular rate and rhythm Abdomen: soft, non-tender; bowel sounds normal Pelvic: gravid uterus  Extremities: Homans sign is negative, no sign of DVT Presentation: cephalic Fetal monitoringBaseline: 135 bpm, Variability: Good {> 6 bpm), Accelerations: Reactive and Decelerations: Absent Uterine activityFrequency: irregular 3-6 minutes Dilation: 1.5 Effacement (%): 70 Station: -2 Exam by:: h stone rnc  Vtx   Prenatal labs: ABO, Rh: --/--/O POS (05/12 1540) Antibody: NEG (05/12 0851) Rubella: 3.15 (10/29 1401) RPR: Non Reactive (02/05 0839)  HBsAg: Negative (10/29 1401)  HIV: Non Reactive (02/05 0839)  GBS: Negative/-- (04/12 1130)  2 hr Glucola  (62, 126, 85) Genetic screening: Low risk NIPS Anatomy US: normal  Prenatal Transfer Tool  Maternal Diabetes: No Genetic Screening: Abnormal:  Results: Other: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, LR NIPS Maternal Ultrasounds/Referrals: Normal Fetal Ultrasounds or other Referrals:  None Maternal Substance Abuse:  No Significant Maternal Medications:  None Significant Maternal Lab Results: Group B Strep negative  Results for orders placed or performed during the hospital encounter of 08/31/19 (from the past 24 hour(s))  CBC    Collection Time: 08/31/19  8:51 AM  Result Value Ref Range   WBC 5.9 4.0 - 10.5 K/uL   RBC 5.05 3.87 - 5.11 MIL/uL   Hemoglobin 13.3 12.0 - 15.0 g/dL   HCT 40.1 36.0 - 46.0 %   MCV 79.4 (L) 80.0 - 100.0 fL   MCH 26.3 26.0 - 34.0 pg   MCHC 33.2 30.0 - 36.0 g/dL   RDW 14.4 11.5 - 15.5 %   Platelets 177 150 -  400 K/uL   nRBC 0.0 0.0 - 0.2 %  Type and screen   Collection Time: 08/31/19  8:51 AM  Result Value Ref Range   ABO/RH(D) O POS    Antibody Screen NEG    Sample Expiration      09/03/2019,2359 Performed at [facility], [place] [address] [place], [place] [address]     Patient Active Problem List   Diagnosis Date Noted  . Post term pregnancy over 40 weeks 08/31/2019  . Genetic carrier status 04/13/2019  . Supervision of normal first pregnancy, antepartum 03/15/2019  . Patient underweight 03/15/2019  . Lactose intolerance 03/15/2019    Assessment/Plan:  Tyreesha Maharaj is a 24 y.o. G1P0 at 6w0dhere for  #Labor: Admit to L&D.  Foley bulb and Cytotec risks and benefits discussed with mom and she is agreeable.  Successful Foley bulb placed.  #Pain: Per pt request  #FWB: Cat 1  #ID:  None, GBS negative #MOF: breast and bottle  #MOC:undecided  #Circ:  NA, girl   VLyndee Hensen DO  08/31/2019, 10:06 AM  I was present for the exam, verified proper foley bulb placement vie cervical exam and agree with above.  STamala Julian V[place] CCreola5/03/2020 10:45 AM

## 2019-08-31 NOTE — Anesthesia Preprocedure Evaluation (Signed)
Anesthesia Evaluation  Patient identified by MRN, date of birth, ID band Patient awake    Reviewed: Allergy & Precautions, H&P , NPO status , Patient's Chart, lab work & pertinent test results, reviewed documented beta blocker date and time   Airway Mallampati: I  TM Distance: >3 FB Neck ROM: full    Dental no notable dental hx. (+) Teeth Intact, Dental Advisory Given   Pulmonary neg pulmonary ROS,    Pulmonary exam normal breath sounds clear to auscultation       Cardiovascular negative cardio ROS Normal cardiovascular exam Rhythm:regular Rate:Normal     Neuro/Psych negative neurological ROS  negative psych ROS   GI/Hepatic negative GI ROS, Neg liver ROS,   Endo/Other  negative endocrine ROS  Renal/GU negative Renal ROS  negative genitourinary   Musculoskeletal negative musculoskeletal ROS (+)   Abdominal   Peds  Hematology negative hematology ROS (+)   Anesthesia Other Findings   Reproductive/Obstetrics (+) Pregnancy                             Anesthesia Physical Anesthesia Plan  ASA: II  Anesthesia Plan: Epidural   Post-op Pain Management:    Induction:   PONV Risk Score and Plan:   Airway Management Planned:   Additional Equipment:   Intra-op Plan:   Post-operative Plan:   Informed Consent: I have reviewed the patients History and Physical, chart, labs and discussed the procedure including the risks, benefits and alternatives for the proposed anesthesia with the patient or authorized representative who has indicated his/her understanding and acceptance.     Dental Advisory Given  Plan Discussed with: Anesthesiologist and CRNA  Anesthesia Plan Comments: (Labs checked- platelets confirmed with RN in room. Fetal heart tracing, per RN, reported to be stable enough for sitting procedure. Discussed epidural, and patient consents to the procedure:  included risk of  possible headache,backache, failed block, allergic reaction, and nerve injury. This patient was asked if she had any questions or concerns before the procedure started.)        Anesthesia Quick Evaluation

## 2019-08-31 NOTE — Progress Notes (Signed)
Christina Reeves is a 24 y.o. G1P0 at [redacted]w[redacted]d.  Subjective: Comfortable w/ epidural   Objective: BP 118/85   Pulse 70   Temp 98.3 F (36.8 C) (Oral)   Resp 16   Ht 5\' 5"  (1.651 m)   Wt 60.3 kg   LMP 11/29/2018   SpO2 100%   BMI 22.13 kg/m    FHT:  FHR: 125 bpm, variability: mod,  accelerations:  15x15,  decelerations:  none UC:   Q 1-4 minutes, moderate-strong Dilation: 5.5 Effacement (%): 90 Cervical Position: Anterior Station: -1 Presentation: Vertex Exam by:: [email or web address], CNM  Labs: Results for orders placed or performed during the hospital encounter of 08/31/19 (from the past 24 hour(s))  CBC     Status: Abnormal   Collection Time: 08/31/19  8:51 AM  Result Value Ref Range   WBC 5.9 4.0 - 10.5 K/uL   RBC 5.05 3.87 - 5.11 MIL/uL   Hemoglobin 13.3 12.0 - 15.0 g/dL   HCT 10/31/19 23.7 - 62.8 %   MCV 79.4 (L) 80.0 - 100.0 fL   MCH 26.3 26.0 - 34.0 pg   MCHC 33.2 30.0 - 36.0 g/dL   RDW 31.5 17.6 - 16.0 %   Platelets 177 150 - 400 K/uL   nRBC 0.0 0.0 - 0.2 %  RPR     Status: None   Collection Time: 08/31/19  8:51 AM  Result Value Ref Range   RPR Ser Ql NON REACTIVE NON REACTIVE  Type and screen     Status: None   Collection Time: 08/31/19  8:51 AM  Result Value Ref Range   ABO/RH(D) O POS    Antibody Screen NEG    Sample Expiration      09/03/2019,2359 Performed at [facility], [address] [place], [place]   ABO/Rh     Status: None   Collection Time: 08/31/19  8:51 AM  Result Value Ref Range   ABO/RH(D)      O POS Performed at [facility], [address], [place], [place]     Assessment / Plan: [redacted]w[redacted]d week IUP Labor: Active Fetal Wellbeing:  Category I Pain Control:  Epidural Anticipated MOD:  SVD  [redacted]w[redacted]d, CNM 08/31/2019 6:31 PM

## 2019-08-31 NOTE — Progress Notes (Addendum)
Labor Progress Note Christina Reeves is a 24 y.o. G1P0 at [redacted]w[redacted]d presented for elective IOL.   S: Patient uncomfortable but husband states she just received something for nausea.   O:  BP 119/74   Pulse 75   Temp 97.6 F (36.4 C) (Axillary)   Resp 16   Ht 5\' 5"  (1.651 m)   Wt 60.3 kg   LMP 11/29/2018   BMI 22.13 kg/m  EFM: 125/mod varibility/pos accels, no decels  TOCO:   CVE: Dilation: 1.5 Effacement (%): 70 Cervical Position: Middle Station: -2 Presentation: Vertex Exam by:: h stone rnc   A&P: 24 y.o. G1P0 [redacted]w[redacted]d admitted for eIOL.  #Labor: Foley bulb in place.  Will await expulsion. Recheck after 4 hour Cytotec window (~1357). Anticipate vaginal delivery.  #Pain: IV pain medication per pt request  #FWB: Cat 1  #GBS negative   [name], DO 12:25 PM

## 2019-08-31 NOTE — Anesthesia Procedure Notes (Signed)
Epidural Patient location during procedure: OB Start time: 08/31/2019 3:43 PM End time: 08/31/2019 3:47 PM  Staffing Anesthesiologist: Bethena Midget, MD  Preanesthetic Checklist Completed: patient identified, IV checked, site marked, risks and benefits discussed, surgical consent, monitors and equipment checked, pre-op evaluation and timeout performed  Epidural Patient position: sitting Prep: DuraPrep and site prepped and draped Patient monitoring: continuous pulse ox and blood pressure Approach: midline Location: L3-L4 Injection technique: LOR air  Needle:  Needle type: Tuohy  Needle gauge: 17 G Needle length: 9 cm and 9 Needle insertion depth: 4 cm Catheter type: closed end flexible Catheter size: 19 Gauge Catheter at skin depth: 9 cm Test dose: negative  Assessment Events: blood not aspirated, injection not painful, no injection resistance, no paresthesia and negative IV test

## 2019-09-01 NOTE — Lactation Note (Signed)
This note was copied from a baby's chart. Lactation Consultation Note  Patient Name: Christina Reeves [id number] Date: 09/01/2019 Reason for consult: Follow-up assessment;Difficult latch Referral from RN.  RN reports that baby comes off and on the breast and is not latching well. Assist with latching infant.  She sucks for a few sucks and comes off and on crying or falls asleep.  Attempt with 24 mm nipple shield and infant continuously roots around with it in her mouth.  Will not latch at all.  After a few attempts, infant upset, left STS with mom.  Urged her to keep STS and as soon as she started cuing to attempt.  Urged to keep pumping every 3 hours and prepump prior to trying to latch.  PM LC to follow up with mom.  Urged her to call lactation as needed. Maternal Data    Feeding Feeding Type: Breast Fed  LATCH Score Latch: Repeated attempts needed to sustain latch, nipple held in mouth throughout feeding, stimulation needed to elicit sucking reflex.  Audible Swallowing: None  Type of Nipple: Everted at rest and after stimulation  Comfort (Breast/Nipple): Soft / non-tender  Hold (Positioning): Assistance needed to correctly position infant at breast and maintain latch.  LATCH Score: 6  Interventions Interventions: Assisted with latch;Hand express;Pre-pump if needed;Hand pump;DEBP  Lactation Tools Discussed/Used Breast pump type: Double-Electric Breast Pump;Manual   Consult Status Consult Status: Follow-up Date: 09/01/19 Follow-up type: In-patient    [name] Michaelle Copas 09/01/2019, 10:26 PM

## 2019-09-01 NOTE — Lactation Note (Signed)
This note was copied from a baby's chart. Lactation Consultation Note  Patient Name: Girl Khailee Mick [id number] Date: 09/01/2019 Reason for consult: Initial assessment;1st time breastfeeding;Term P1, 11 hour term female infant. Infant had 3 stools and 2 voids. Per parents, infant had a lot of emesis ( brown in color) was seen by Pediatrician. Tools given : breast shells, hand pump, DEBP and NS only if needed, infant latched without NS briefly, mom has pseudo-inverted nipples.  Infant was cuing to breastfeed, mom latched infant on her right breast using the football hold position. Infant was on and off breast not sustaining latch, mom was given 20 mm NS, infant continue to be on and off breast for 5 minutes. Mom will wear breast shells in bra during the day to help evert nipple shaft out more. Mom will pre-pump breast with hand pump prior to latching infant at breast and mom will fist attempt to latch infant without 20 mm NS. Mom was taught hand expression and infant was given 5 mls of colostrum by spoon. Mom will continue to work on latching infant at breast and if infant is not latching mom will hand express and give by volume. Mom knows to breastfeed infant according to hunger cues, 8 to 12 times within 24 hours and not exceed 3 hours without latching infant at breast. Mom was pumping when LC left the room, mom knows to pump every 3 hours for 15 minutes on initial setting. Mom will continue to work towards latching infant at breast.  Reviewed Baby & Me book's Breastfeeding Basics.  Mom made aware of O/P services, breastfeeding support groups, community resources, and our phone # for post-discharge questions.   Maternal Data Formula Feeding for Exclusion: No Has patient been taught Hand Expression?: Yes Does the patient have breastfeeding experience prior to this delivery?: No  Feeding Feeding Type: Breast Fed  LATCH Score Latch: Grasps breast easily, tongue down, lips flanged,  rhythmical sucking.  Audible Swallowing: A few with stimulation  Type of Nipple: Inverted  Comfort (Breast/Nipple): Soft / non-tender  Hold (Positioning): Assistance needed to correctly position infant at breast and maintain latch.  LATCH Score: 6  Interventions Interventions: Breast feeding basics reviewed;Breast compression;Assisted with latch;Adjust position;Skin to skin;Support pillows;Breast massage;Position options;Hand express;Expressed milk;Pre-pump if needed;Shells;DEBP;Hand pump  Lactation Tools Discussed/Used Tools: Shells;Pump;Nipple Shields Nipple shield size: 20 WIC Program: Yes Pump Review: Setup, frequency, and cleaning;Milk Storage Initiated by:: Danelle Earthly, IBCLC Date initiated:: 09/01/19   Consult Status Consult Status: Follow-up Date: 09/01/19 Follow-up type: In-patient    Danelle Earthly 09/01/2019, 6:43 AM

## 2019-09-01 NOTE — Progress Notes (Signed)
CSW received consult due to score 10 on Edinburgh Depression Screen.    CSW congratulated MOB and FOB on the birth of infant. CSW advised MOB of CSW's role and the reason for CSW coming to visit with her. MOB reported that she has had "not much" going on for her the past 7 days. MOB reported that she was starting to feel some anxiety around giving birth but reports that she has no mental health hx of anxiety or depression. MOB verbalized that once she gave birth she felt much more ease about things. MOB reported that she has never been in therapy and declined resources when offered. MOB expressed that she hasn't felt SI or Hi and reported that she has a good support system at this time.   MOB reported that she has all needed items to care for infant with no current needs.   CSW provided education regarding Baby Blues vs PMADs and provided MOB with resources for mental health follow up.  CSW encouraged MOB to evaluate her mental health throughout the postpartum period with the use of the New Mom Checklist developed by Postpartum [facility] as well as the Edinburgh Postnatal Depression Scale and notify a medical professional if symptoms arise.     Christina Reeves S. Christina Reeves, MSW, LCSW [facility] [place] [phone]  

## 2019-09-01 NOTE — Progress Notes (Signed)
POSTPARTUM PROGRESS NOTE  Subjective: Christina Reeves is a 24 y.o. G1P1001 s/p VD at [redacted]w[redacted]d.  She reports she doing well. No acute events overnight. She denies any problems with ambulating, voiding or po intake. Denies nausea or vomiting. She has passed flatus. Pain is well controlled.  Lochia is normal.  Objective: Blood pressure (!) 112/58, pulse 65, temperature 98.7 F (37.1 C), temperature source Oral, resp. rate 18, height 5\' 5"  (1.651 m), weight 60.3 kg, last menstrual period 11/29/2018, SpO2 100 %, unknown if currently breastfeeding.  Physical Exam:  General: alert, cooperative and no distress Chest: no respiratory distress Abdomen: soft, non-tender  Uterine Fundus: firm, appropriately tender Extremities: No calf swelling or tenderness  no edema  Recent Labs    08/31/19 0851  HGB 13.3  HCT 40.1    Assessment/Plan: Christina Reeves is a 24 y.o. G1P1001 s/p VD at [redacted]w[redacted]d.  Routine Postpartum Care: Doing well, pain well-controlled.  -- Continue routine care, lactation support  -- Contraception: still unsure, but likely IUD outpt -- Feeding: Breast and bottle  Dispo: Plan for discharge likely 5/14 AM.  6/14, DO, PGY1 [facility] [place]

## 2019-09-01 NOTE — Lactation Note (Signed)
This note was copied from a baby's chart. Lactation Consultation Note  Patient Name: Christina Reeves [id number] Date: 09/01/2019   P1, 8 hour female infant. LC entered room Mom and infant asleep and dad awake at this time sitting in chair.   Maternal Data    Feeding Feeding Type: Breast Fed  LATCH Score                   Interventions    Lactation Tools Discussed/Used     Consult Status      Danelle Earthly 09/01/2019, 3:43 AM

## 2019-09-01 NOTE — Anesthesia Postprocedure Evaluation (Signed)
Anesthesia Post Note  Patient: Christina Reeves  Procedure(s) Performed: AN AD HOC LABOR EPIDURAL     Patient location during evaluation: Mother Baby Anesthesia Type: Epidural Level of consciousness: awake and alert and oriented Pain management: satisfactory to patient Vital Signs Assessment: post-procedure vital signs reviewed and stable Respiratory status: respiratory function stable Cardiovascular status: stable Postop Assessment: no headache, no backache, epidural receding, patient able to bend at knees, no signs of nausea or vomiting, adequate PO intake and no apparent nausea or vomiting Anesthetic complications: no    Last Vitals:  Vitals:   09/01/19 0143 09/01/19 0500  BP: (!) 112/58 102/70  Pulse: 65 63  Resp: 18 16  Temp: 37.1 C 37.1 C  SpO2: 100% 100%    Last Pain:  Vitals:   09/01/19 1135  TempSrc:   PainSc: 0-No pain   Pain Goal:                   Christina Reeves

## 2019-09-01 NOTE — Lactation Note (Signed)
This note was copied from a baby's chart. Lactation Consultation Note  Patient Name: Christina Reeves [id number] Date: 09/01/2019 Reason for consult: Follow-up assessment;Term;Primapara;1st time breastfeeding  P1 mother whose infant is now 94 hours old.  This is a term baby at 40+0 weeks.  Baby was STS on mother's chest when I arrived.  Mother feels like baby has been latching well and denies pain with latching.  She is supplementing with hand expressed colostrum via spoon.  Mother has no questions/concerns at this time.  Suggested she call her RN/LC for latch assistance as needed, especially since this is mother's first time breast feeding.  Mother verbalized understanding.  Mother will continue to feed 8-12 times/24 hours or sooner if baby shows feeding cues.  She will continue to practice hand expression and feed back any EBM she obtains to baby.  Baby has had multiple voids/stools.  Mother has a DEBP for home use.  Father present.     Maternal Data    Feeding Feeding Type: Breast Milk  LATCH Score                   Interventions    Lactation Tools Discussed/Used     Consult Status Consult Status: Follow-up Date: 09/02/19 Follow-up type: In-patient    Christina Reeves R Christina Reeves 09/01/2019, 1:39 PM

## 2019-09-02 ENCOUNTER — Ambulatory Visit: Payer: Self-pay

## 2019-09-02 MED ORDER — IBUPROFEN 600 MG PO TABS: 600.0000 mg | ORAL_TABLET | Freq: Four times a day (QID) | ORAL | 0 refills | Status: DC

## 2019-09-02 NOTE — Lactation Note (Addendum)
This note was copied from a baby's chart. Lactation Consultation Note F/u w/mom about weight loss. Strongly suggested supplementation. Mom stated she is giving colostrum. Explained 2-4 ml isn't enough at this time since the baby is older. Gave mom LPI information sheet on supplementing. Baby needs 10-20 ml of something. Encouraged Donor milk. Mom agreed. LC got Donor milk and reviewed milk storage. W/curve tip syring demonstrated giving to baby w/gloved finger.  LC saw mom giving colostrum in curve tip syring by just sticking curve tip syring in her mouth and squirting colostrum. Informed mom that she couldn't give that way, it could hurt baby or baby could get choked. Encouraged to give baby rest breaks and to squirt a little at a time before inserting more. Gave 15 ml Donor milk. Encouraged strict I&O to keep up with baby's I&O. Encouraged to measure colostrum in bottles and warm it up. Parents state understanding. Reported to RN.  Patient Name: Christina Reeves [id number] Date: 09/02/2019 Reason for consult: Infant < 6lbs;Primapara;Term;Infant weight loss   Maternal Data    Feeding Feeding Type: Breast Fed  LATCH Score Latch: Too sleepy or reluctant, no latch achieved, no sucking elicited.  Audible Swallowing: None  Type of Nipple: Everted at rest and after stimulation  Comfort (Breast/Nipple): Soft / non-tender  Hold (Positioning): No assistance needed to correctly position infant at breast.  LATCH Score: 6  Interventions Interventions: Breast feeding basics reviewed;Support pillows;Skin to skin;Breast massage;Breast compression;DEBP;Hand pump  Lactation Tools Discussed/Used     Consult Status Consult Status: Follow-up Date: 09/02/19 Follow-up type: In-patient    Charyl Dancer 09/02/2019, 5:43 AM

## 2019-09-02 NOTE — Lactation Note (Signed)
This note was copied from a baby's chart. Lactation Consultation Note Baby 31 hrs old. Not very aggressive at the breast. Baby on the breast, mainly holding nipple in her mouth. Mom stated baby will occasionally suck. Encouraged breast massage while on the breast.  Discussed how long baby should be on the breast feeding. Encourage to give the baby "something" after BF since she is less than 6 lbs. Hand express colostrum and spoon feed baby. Amount need to be documented. Encouraged cheeks to breast while BF. Discussed props and positioning. Newborn feeding habits mentioned. Encouraged to pump. Call for assistance as needed. RN notified. Mom seems a little to passive. Needs to be more aggressive w/feedings and supplementing. Baby will not be ready for d/c home today unless drastically improves.  Patient Name: Christina Reeves [id number] Date: 09/02/2019 Reason for consult: Follow-up assessment;Infant < 6lbs;Term   Maternal Data    Feeding Feeding Type: Breast Fed  LATCH Score Latch: Too sleepy or reluctant, no latch achieved, no sucking elicited.  Audible Swallowing: None  Type of Nipple: Everted at rest and after stimulation  Comfort (Breast/Nipple): Soft / non-tender  Hold (Positioning): No assistance needed to correctly position infant at breast.  LATCH Score: 6  Interventions Interventions: Breast feeding basics reviewed;Support pillows;Skin to skin;Breast massage;Breast compression;DEBP;Hand pump  Lactation Tools Discussed/Used     Consult Status Consult Status: Follow-up Date: 09/02/19 Follow-up type: In-patient    [name], [name] 09/02/2019, 2:30 AM

## 2019-09-02 NOTE — Lactation Note (Signed)
This note was copied from a baby's chart. Lactation Consultation Note Attempted to see mom, mom sleeping soundly.  Patient Name: Christina Reeves [id number] Date: 09/02/2019     Maternal Data    Feeding Feeding Type: Breast Fed  LATCH Score                   Interventions    Lactation Tools Discussed/Used     Consult Status      Charyl Dancer 09/02/2019, 12:41 AM

## 2019-09-02 NOTE — Discharge Instructions (Signed)

## 2019-09-02 NOTE — Lactation Note (Signed)
This note was copied from a baby's chart. Lactation Consultation Note  Patient Name: Girl Layli Capshaw ETKKO'E Date: 09/02/2019 Reason for consult: Infant < 6lbs;Primapara;Term;Infant weight loss  Infant is 37 hrs old. I spoke with parents about their current mode of supplementation (finger-feeding with a curved-tip syringe). Dad says he doesn't like it & parents think infant doesn't like this mode. When asked, Dad says that he is needing to push the plunger when finger-feeding with a curved-tip syringe. I talked about its potential drawback (aspiration) & it was decided to find a different mode of supplementation. I mentioned supplementing at breast (Mom says infant latches well, but there may not be enough maternal supply); cup; or bottle.   Mom will call for me to return when infant is ready to feed again so that I can assess how feeding at the breast is going & to help determine next steps in supplementing.   Lurline Hare [facility] 09/02/2019, 8:47 AM

## 2019-09-02 NOTE — Lactation Note (Signed)
This note was copied from a baby's chart. Lactation Consultation Note  Patient Name: Christina Reeves [id number] Date: 09/02/2019 Reason for consult: Follow-up assessment;Term;Primapara;1st time breastfeeding;Infant weight loss  P1 mother whose infant is now 23 hours old.  This is a term baby at 40+0 weeks with a 9% weight loss this morning.  RN in room when I arrived.  Mother had recently breast fed for 8 minutes.  Offered to awaken baby and assess latch/feeding and mother agreeable.    Mother's breasts are soft and non tender and nipples are everted and intact.  Asked mother to demonstrate hand expression and she was able to express drops which I finger fed back to baby.  Baby is quite sleepy and slightly jaundiced.  Demonstrated techniques used to awaken baby.  Allowed her time to awaken and suck training performed.  She began to arouse more with suck training.  Mother had 3 mls of EBM at bedside which was fed back via curved tip syringe with ease prior to latching.  Assisted baby to latch in the football hold on the right breast after a couple of attempts.  She required constant stimulation to awaken and suck at the breast.  After approximately 5 minutes she began to suck with a more rhythmic motion.  Mother denied pain and was pleased to see her daughter feeding.  Demonstrated breast compressions for mother and included father in the teaching so he will be able to assist mother after discharge.  Educated on breast feeding basics during baby's feeding session.  Moderate constant stimulation still required for her to be interested in feeding.  Occasional swallows were noted.  Spoke with parents about being more assertive with baby before and during feedings.  She requires much stimulation to be actively engaged with her feedings.  Demonstrated how to remove her from the breast when she becomes too sleepy and to awake her and burp her for more active involvement.  She continued to feed for 7 additional  minutes.  After she became too sleepy to continue I suggested parents supplement.  Recommended supplementation after every feeding to help baby with her energy level and to minimize any further weight loss.  Demonstrated feeding with the cup and infant consumed 15 mls of donor milk fairly easily.  Burped well.  At the end of her supplementation mother was able to obtain an additional 5 mls from pumping and I fed this back to baby via the cup.  Parents seem interested in using this method.  I explained the curved tip syringe is easy to use for small volumes initially, but is not practical for larger volumes.  Parents in agreement.  Mother will awaken and feed baby at least every three hours.  Father will supplement with donor milk of mother's EBM as available and mother will continue to post pump after every feeding.  Reminded her to keep this routine up throughout the day and evening in hopes of being discharged on Saturday.  Parents are willing to follow through with this feeding plan.  Mother will call for latch assistance as needed.  Mother has a DEBP for home use.  Father very supportive and involved.  Both parents receptive to all teaching.  RN updated.   Maternal Data Formula Feeding for Exclusion: No Has patient been taught Hand Expression?: Yes Does the patient have breastfeeding experience prior to this delivery?: No  Feeding Feeding Type: Breast Fed  LATCH Score Latch: Repeated attempts needed to sustain latch, nipple held in mouth  throughout feeding, stimulation needed to elicit sucking reflex.  Audible Swallowing: A few with stimulation  Type of Nipple: Everted at rest and after stimulation  Comfort (Breast/Nipple): Soft / non-tender  Hold (Positioning): Assistance needed to correctly position infant at breast and maintain latch.  LATCH Score: 7  Interventions Interventions: Breast feeding basics reviewed;Skin to skin;Assisted with latch;Breast massage;Hand express;Breast  compression;Adjust position;DEBP;Expressed milk;Position options;Support pillows  Lactation Tools Discussed/Used Tools: Pump;Feeding cup Breast pump type: Double-Electric Breast Pump;Manual Pump Review: Setup, frequency, and cleaning;Milk Storage(Reviewed)   Consult Status Consult Status: Follow-up Date: 09/03/19 Follow-up type: In-patient    Kayleanna Lorman R Corneluis Allston 09/02/2019, 1:26 PM

## 2019-09-03 ENCOUNTER — Ambulatory Visit: Payer: Self-pay

## 2019-09-03 NOTE — Lactation Note (Signed)
This note was copied from a baby's chart. Lactation Consultation Note  Patient Name: Girl Seymone Forlenza [id number] Date: 09/03/2019 Reason for consult: Follow-up assessment;Primapara;1st time breastfeeding;Term;Infant weight loss;Other (Comment)(7 % weight  loss , for D/C today)  Baby is 46 hours old As LC entered the room, mom packing up for D/C .  Per dad baby has eaten in the last 2 hours / breast and EBM in a bottle.  LC reviewed importance of STS until the baby is back to birth weight, gaining steadily and  Can stay awake for majority of the feeding.  Reviewed 24 hours breast feeding goals of 8-12 times/ STS.  Nutritive vs non - nutritive feeding patterns and the importance of watching the baby for hanging  Out latched.  Reviewed cluster feeding as being normal.  Mom has the [facility] pamphlet with phone numbers.  Per mom has a hand pump and a DEBP at home.     Maternal Data    Feeding Feeding Type: (last ate at 8am EBM and then breast fed)  LATCH Score                   Interventions Interventions: Breast feeding basics reviewed  Lactation Tools Discussed/Used     Consult Status Consult Status: Complete Date: 09/03/19    Kathrin Greathouse 09/03/2019, 10:34 AM

## 2019-09-30 ENCOUNTER — Ambulatory Visit (EMERGENCY_DEPARTMENT_HOSPITAL): Payer: Medicaid Other | Admitting: Family Medicine

## 2019-09-30 ENCOUNTER — Other Ambulatory Visit: Payer: Self-pay

## 2019-09-30 DIAGNOSIS — Z30430 Encounter for insertion of intrauterine contraceptive device: Secondary | ICD-10-CM

## 2019-09-30 MED ORDER — LEVONORGESTREL 19.5 MCG/DAY IU IUD
INTRAUTERINE_SYSTEM | Freq: Once | INTRAUTERINE | Status: AC
  Administered 2019-09-30: 1 via INTRAUTERINE

## 2019-09-30 NOTE — Progress Notes (Signed)
    Post Partum Visit Note  Christina Reeves is a 24 y.o. G22P1001 female who presents for a postpartum visit. She is 4 weeks postpartum following a normal spontaneous vaginal delivery.  I have fully reviewed the prenatal and intrapartum course. The delivery was at 40 gestational weeks.  Anesthesia: epidural. Postpartum course has been uneventful. Baby is doing well. Baby is feeding by both breast and bottle. Bleeding no bleeding. Bowel function is normal. Bladder function is normal. Patient is not sexually active. Contraception method is undecided. Postpartum depression screening: negative. score0   Review of Systems Pertinent items are noted in HPI.    Objective:  Blood pressure 116/76, pulse 74, weight 121 lb (54.9 kg), unknown if currently breastfeeding.  General:  alert, cooperative and no distress  Lungs: clear to auscultation bilaterally  Heart:  regular rate and rhythm, S1, S2 normal, no murmur, click, rub or gallop  Abdomen: soft, non-tender; bowel sounds normal; no masses,  no organomegaly   Vulva:  normal  Vagina: normal vagina, no discharge, exudate, lesion, or erythema  Cervix:  multiparous appearance   IUD Procedure Note Patient identified, informed consent performed, signed copy in chart, time out was performed.  Urine pregnancy test negative.  Speculum placed in the vagina.  Cervix visualized.  Cleaned with Betadine x 2.  Grasped anteriorly with a single tooth tenaculum.  Uterus sounded to 9 cm.  Liletta  IUD placed per manufacturer's recommendations.  Strings trimmed to 3 cm. Tenaculum was removed, good hemostasis noted.  Patient tolerated procedure well.   Patient given post procedure instructions and Liletta care card with expiration date.  Patient is asked to check IUD strings periodically and follow up in 4-6 weeks for IUD check.        Assessment:    Normal postpartum exam. Pap smear not done at today's visit. Last pap 02-17-2019 WNL.  Plan:   Essential  components of care per ACOG recommendations:  1.  Mood and well being: Patient with negative depression screening today. Reviewed local resources for support.  - Patient does not use tobacco.   2. Infant care and feeding:  -Patient currently breastmilk feeding? Yes Discussed return to work and pumping.  Reviewed importance of draining breast regularly to support lactation. -Social determinants of health (SDOH) reviewed in EPIC. No concerns  3. Sexuality, contraception and birth spacing - Patient does not want a pregnancy in the next year.   - Reviewed forms of contraception in tiered fashion. Patient desired IUD today.   - Discussed birth spacing of 18 months  4. Sleep and fatigue -Encouraged family/partner/community support of 4 hrs of uninterrupted sleep to help with mood and fatigue  5. Physical Recovery  - Discussed patients delivery and complications - Patient did not have a laceration, perineal healing reviewed. Patient expressed understanding - Patient has urinary incontinence? No - Patient is safe to resume physical and sexual activity  6.  Health Maintenance - Last pap smear done 02-17-2020 and was normal with negative HPV.  Levie Heritage, DO [facility] [employer], [facility]

## 2019-10-14 ENCOUNTER — Ambulatory Visit (INDEPENDENT_AMBULATORY_CARE_PROVIDER_SITE_OTHER): Payer: Medicaid Other | Admitting: Family Medicine

## 2019-10-14 ENCOUNTER — Other Ambulatory Visit: Payer: Self-pay

## 2019-10-14 ENCOUNTER — Encounter: Payer: Self-pay | Admitting: Family Medicine

## 2019-10-14 VITALS — BP 131/74 | HR 69 | Ht 65.0 in | Wt 123.0 lb

## 2019-10-14 DIAGNOSIS — N921 Excessive and frequent menstruation with irregular cycle: Secondary | ICD-10-CM

## 2019-10-14 DIAGNOSIS — Z30431 Encounter for routine checking of intrauterine contraceptive device: Secondary | ICD-10-CM | POA: Diagnosis not present

## 2019-10-14 MED ORDER — MEDROXYPROGESTERONE ACETATE 10 MG PO TABS: 20.0000 mg | ORAL_TABLET | Freq: Every day | ORAL | 0 refills | Status: DC

## 2019-10-14 NOTE — Progress Notes (Signed)
   Subjective:   Patient Name: Christina Reeves, female   DOB: 02-17-1996, 24 y.o.  MRN: [id number]  HPI Patient here for an IUD check.  She had the Liletta IUD placed 2 month ago.  She reports spotting and bleeding daily.   Review of Systems  Constitutional: Negative for fever and chills.  Gastrointestinal: Negative for abdominal pain.  Genitourinary: Negative for vaginal discharge, vaginal pain, pelvic pain and dyspareunia.        Objective:   Physical Exam  Constitutional: She appears well-developed and well-nourished.  HENT:  Head: Normocephalic and atraumatic.  Abdominal: Soft. There is no tenderness. There is no guarding.  Genitourinary: There is no rash, tenderness or lesion on the right labia. There is no rash, tenderness or lesion on the left labia. No erythema or tenderness in the vagina. No foreign body around the vagina. No signs of injury around the vagina. No vaginal discharge found.    Skin: Skin is warm and dry.  Psychiatric: She has a normal mood and affect. Her behavior is normal. Judgment and thought content normal.       Assessment & Plan:  1. IUD check up IUD in place. Start provera 20mg  daily. Pt to call with any other problems.  Recheck 4-6 weeks.

## 2019-10-14 NOTE — Progress Notes (Signed)
Patient having a good amount of bleeding and wants to check her string of her IUD. Armandina Stammer RN

## 2019-10-20 ENCOUNTER — Ambulatory Visit: Payer: Medicaid Other | Admitting: Family Medicine

## 2019-10-27 ENCOUNTER — Ambulatory Visit: Payer: Medicaid Other | Admitting: Family Medicine

## 2019-11-17 ENCOUNTER — Other Ambulatory Visit: Payer: Self-pay | Admitting: Family Medicine

## 2019-11-17 MED ORDER — CITALOPRAM HYDROBROMIDE 20 MG PO TABS: 20.0000 mg | ORAL_TABLET | Freq: Every day | ORAL | 3 refills | Status: DC

## 2019-11-23 MED ORDER — METOCLOPRAMIDE HCL 10 MG PO TABS
10.0000 mg | ORAL_TABLET | Freq: Three times a day (TID) | ORAL | 2 refills | Status: DC
Start: 2019-11-23 — End: 2020-02-27

## 2019-11-25 ENCOUNTER — Ambulatory Visit: Payer: Medicaid Other | Admitting: Family Medicine

## 2019-12-01 ENCOUNTER — Ambulatory Visit: Payer: Medicaid Other | Admitting: Family Medicine

## 2019-12-08 ENCOUNTER — Ambulatory Visit (INDEPENDENT_AMBULATORY_CARE_PROVIDER_SITE_OTHER): Payer: Medicaid Other | Admitting: Family Medicine

## 2019-12-08 ENCOUNTER — Encounter: Payer: Self-pay | Admitting: Family Medicine

## 2019-12-08 ENCOUNTER — Other Ambulatory Visit: Payer: Self-pay

## 2019-12-08 VITALS — BP 130/88 | HR 70 | Wt 116.0 lb

## 2019-12-08 DIAGNOSIS — N921 Excessive and frequent menstruation with irregular cycle: Secondary | ICD-10-CM

## 2019-12-08 DIAGNOSIS — Z30432 Encounter for removal of intrauterine contraceptive device: Secondary | ICD-10-CM | POA: Diagnosis not present

## 2019-12-08 MED ORDER — NORETHINDRONE 0.35 MG PO TABS
1.0000 | ORAL_TABLET | Freq: Every day | ORAL | 3 refills | Status: DC
Start: 2019-12-08 — End: 2020-02-27

## 2019-12-08 NOTE — Progress Notes (Signed)
Still having irregular bleeding and cramping with IUD. Not improved with additional progesterone. Still breastfeeding. Discussed options - would like to remove IUD and just use POPs.  IUD Removal  Patient was in the dorsal lithotomy position, normal external genitalia was noted.  A speculum was placed in the patient's vagina, normal discharge was noted, no lesions. The multiparous cervix was visualized, no lesions, no abnormal discharge,  and was swabbed with Betadine using scopettes.  The strings of the IUD was grasped and pulled using ring forceps.  The IUD was successfully removed in its entirety.  Patient tolerated the procedure well.

## 2019-12-08 NOTE — Progress Notes (Signed)
Patient is breastfeeding. Patient states that she has had a lot of cramping with the IUD and still regularily spotting. Armandina Stammer RN

## 2020-02-27 ENCOUNTER — Telehealth: Payer: Self-pay

## 2020-02-27 ENCOUNTER — Telehealth: Payer: Self-pay | Admitting: Family Medicine

## 2020-02-27 ENCOUNTER — Other Ambulatory Visit: Payer: Self-pay | Admitting: Family Medicine

## 2020-02-27 MED ORDER — NORGESTIMATE-ETH ESTRADIOL 0.25-35 MG-MCG PO TABS: 1.0000 | ORAL_TABLET | Freq: Every day | ORAL | 3 refills | Status: DC

## 2020-02-27 NOTE — Telephone Encounter (Signed)
Start sprintec. Make take a couple of months to completely capture her period so she doesn't have breakthrough bleeding. Most people tolerate birth control well, but occasionally people have side effects. Most common side effects are headache, nausea, cramping, dizziness, lightheadedness.  If she experiences any side effects, she should call. If she is still having breakthrough bleeding in 3 months, then she should come in for a visit.

## 2020-02-27 NOTE — Telephone Encounter (Signed)
Pt called stating she is having a period every other week with the birth control pills that she is currently on. Pt states she is no longer breast feeding. Pt made that a message will be sent to the provider. Understanding was voiced. Reeve Mallo l Jalayah Gutridge, CMA

## 2020-02-27 NOTE — Telephone Encounter (Signed)
-----   Message from Mikey Bussing, [place] sent at 02/27/2020  2:23 PM EST ----- Regarding: Birth control pills Pt states she is having a period every other week with the birth control pills that she is currently on. Pt states she is no longer breast feeding.

## 2021-02-28 MED ORDER — DOXYLAMINE-PYRIDOXINE 10-10 MG PO TBEC: 2.0000 | DELAYED_RELEASE_TABLET | Freq: Every day | ORAL | 5 refills | Status: DC

## 2021-03-13 ENCOUNTER — Encounter: Payer: Self-pay | Admitting: Family Medicine

## 2021-04-03 ENCOUNTER — Ambulatory Visit (INDEPENDENT_AMBULATORY_CARE_PROVIDER_SITE_OTHER): Payer: Medicaid Other | Admitting: Family Medicine

## 2021-04-03 ENCOUNTER — Other Ambulatory Visit (HOSPITAL_COMMUNITY)
Admission: RE | Admit: 2021-04-03 | Discharge: 2021-04-03 | Disposition: A | Discharge status: Home / Self Care | Payer: Medicaid Other | Source: Ambulatory Visit | Attending: Family Medicine | Admitting: Family Medicine

## 2021-04-03 ENCOUNTER — Encounter: Payer: Self-pay | Admitting: Family Medicine

## 2021-04-03 ENCOUNTER — Ambulatory Visit: Payer: Self-pay

## 2021-04-03 ENCOUNTER — Other Ambulatory Visit: Payer: Self-pay

## 2021-04-03 VITALS — BP 108/67 | HR 73 | Wt 113.0 lb

## 2021-04-03 DIAGNOSIS — Z3A10 10 weeks gestation of pregnancy: Secondary | ICD-10-CM

## 2021-04-03 DIAGNOSIS — Z3490 Encounter for supervision of normal pregnancy, unspecified, unspecified trimester: Secondary | ICD-10-CM | POA: Diagnosis present

## 2021-04-03 DIAGNOSIS — Z3481 Encounter for supervision of other normal pregnancy, first trimester: Secondary | ICD-10-CM

## 2021-04-03 DIAGNOSIS — F410 Panic disorder [episodic paroxysmal anxiety] without agoraphobia: Secondary | ICD-10-CM

## 2021-04-03 DIAGNOSIS — Z3480 Encounter for supervision of other normal pregnancy, unspecified trimester: Secondary | ICD-10-CM

## 2021-04-03 MED ORDER — HYDROXYZINE HCL 25 MG PO TABS: 12.5000 mg | ORAL_TABLET | Freq: Three times a day (TID) | ORAL | 6 refills | Status: DC | PRN

## 2021-04-03 NOTE — Progress Notes (Signed)
Subjective:  Christina Reeves is a G2P1001 [redacted]w[redacted]d being seen today for her first obstetrical visit.  Her obstetrical history is significant for  previous SGA baby at term. Had SVD without complication. No obstetrical risk factors, no medical risk factors . Patient does intend to breast feed. Pregnancy history fully reviewed.  Patient reports  increased anxiety/panic attacks over the past month. Occurs 1-2 times a week and last for a few minutes. Does have some chest discomfort during the episodes with difficulty breathing and racing heart rate. Happens around the same time as the panic feeling. Can last for a couple of hours.  BP 108/67    Pulse 73    Wt 113 lb (51.3 kg)    LMP 01/10/2021    BMI 18.80 kg/m   HISTORY: OB History  Gravida Para Term Preterm AB Living  $Remov'2 1 1     1  'iCeFLf$ SAB IAB Ectopic Multiple Live Births        0 1    # Outcome Date GA Lbr Len/2nd Weight Sex Delivery Anes PTL Lv  2 Current           1 Term 08/31/19 [redacted]w[redacted]d 03:14 / 00:10 6 lb 0.1 oz (2.724 kg) F Vag-Spont EPI  LIV    No past medical history on file.  Past Surgical History:  Procedure Laterality Date   NO PAST SURGERIES      Family History  Problem Relation Age of Onset   Hypertension Mother    Diabetes Brother    Cancer Paternal Grandmother        breast   Breast cancer Paternal Grandmother    Cancer Paternal Grandfather      Exam  BP 108/67    Pulse 73    Wt 113 lb (51.3 kg)    LMP 01/10/2021    BMI 18.80 kg/m   Chaperone present during exam  CONSTITUTIONAL: Well-developed, well-nourished female in no acute distress.  HENT:  Normocephalic, atraumatic, External right and left ear normal. Oropharynx is clear and moist EYES: Conjunctivae and EOM are normal. Pupils are equal, round, and reactive to light. No scleral icterus.  NECK: Normal range of motion, supple, no masses.  Normal thyroid.  CARDIOVASCULAR: Normal heart rate noted, regular rhythm RESPIRATORY: Clear to auscultation bilaterally.  Effort and breath sounds normal, no problems with respiration noted. BREASTS: Symmetric in size. No masses, skin changes, nipple drainage, or lymphadenopathy. ABDOMEN: Soft, normal bowel sounds, no distention noted.  No tenderness, rebound or guarding.  PELVIC: Normal appearing external genitalia; normal appearing vaginal mucosa and cervix.  MUSCULOSKELETAL: Normal range of motion. No tenderness.  No cyanosis, clubbing, or edema.  2+ distal pulses. SKIN: Skin is warm and dry. No rash noted. Not diaphoretic. No erythema. No pallor. NEUROLOGIC: Alert and oriented to person, place, and time. Normal reflexes, muscle tone coordination. No cranial nerve deficit noted. PSYCHIATRIC: Normal mood and affect. Normal behavior. Normal judgment and thought content.    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 Supervision of other normal pregnancy, antepartum Initial labs obtained Continue prenatal vitamins Reviewed n/v relief measures and warning s/s to report Reviewed recommended weight gain based on pre-gravid BMI Encouraged well-balanced diet Genetic & carrier screening discussed: requests Panorama,  Ultrasound discussed; fetal survey:  requested [place] completed> form faxed if has or is planning to apply for medicaid The nature of [place] for [employer] with multiple MDs and other Advanced Practice Providers was explained to patient; also emphasized that fellows, residents, and students are part of our team.  No indication for aspirin use - US OB Limited; Future - CBC/D/Plt+RPR+Rh+ABO+RubIgG... - Genetic Screening - Culture, OB Urine - [place] MFM OB COMP + 14 WK; Future - CHL AMB BABYSCRIPTS SCHEDULE OPTIMIZATION - Cytology - PAP( [place])  2. [redacted]  weeks gestation of pregnancy  3. Panic attack Check labs for organic reason for panic attacks. Discussed treatment - abortive vs suppressive. At this point, she would like to try vistaril. Discussed side effects. She is sensitive to medication - start with half a tab and increase if needed. - hydrOXYzine (ATARAX) 25 MG tablet; Take 0.5-1 tablets (12.5-25 mg total) by mouth 3 (three) times daily as needed for anxiety.  Dispense: 15 tablet; Refill: 6 - Thyroid Panel With TSH - Comp Met (CMET) - Magnesium  Problem list reviewed and updated. 75% of 30 min visit spent on counseling and coordination of care.     [name] 04/03/2021

## 2021-04-04 ENCOUNTER — Encounter: Payer: Self-pay | Admitting: Family Medicine

## 2021-04-04 LAB — CBC/D/PLT+RPR+RH+ABO+RUBIGG...
Antibody Screen: NEGATIVE
Basophils Absolute: 0 10*3/uL (ref 0.0–0.2)
Basos: 0 %
EOS (ABSOLUTE): 0 10*3/uL (ref 0.0–0.4)
Eos: 1 %
HCV Ab: <0.1 s/co ratio (ref 0.0–0.9)
HIV Screen 4th Generation wRfx: NONREACTIVE
Hematocrit: 32.3 % — ABNORMAL LOW (ref 34.0–46.6)
Hemoglobin: 11 g/dL — ABNORMAL LOW (ref 11.1–15.9)
Hepatitis B Surface Ag: NEGATIVE
Immature Grans (Abs): 0 10*3/uL (ref 0.0–0.1)
Immature Granulocytes: 0 %
Lymphocytes Absolute: 2 10*3/uL (ref 0.7–3.1)
Lymphs: 36 %
MCH: 25.8 pg — ABNORMAL LOW (ref 26.6–33.0)
MCHC: 34.1 g/dL (ref 31.5–35.7)
MCV: 76 fL — ABNORMAL LOW (ref 79–97)
Monocytes Absolute: 0.3 10*3/uL (ref 0.1–0.9)
Monocytes: 5 %
Neutrophils Absolute: 3.2 10*3/uL (ref 1.4–7.0)
Neutrophils: 58 %
Platelets: 183 10*3/uL (ref 150–450)
RBC: 4.26 x10E6/uL (ref 3.77–5.28)
RDW: 13.7 % (ref 11.7–15.4)
RPR Ser Ql: NONREACTIVE
Rh Factor: POSITIVE
Rubella Antibodies, IGG: 2.99 index (ref >0.99)
WBC: 5.5 10*3/uL (ref 3.4–10.8)

## 2021-04-04 LAB — THYROID PANEL WITH TSH
Free Thyroxine Index: 1.8 (ref 1.2–4.9)
T3 Uptake Ratio: 19 % — ABNORMAL LOW (ref 24–39)
T4, Total: 9.3 ug/dL (ref 4.5–12.0)
TSH: 1.37 u[IU]/mL (ref 0.450–4.500)

## 2021-04-04 LAB — CYTOLOGY - PAP
Chlamydia: NEGATIVE
Comment: NEGATIVE
Comment: NORMAL
Diagnosis: NEGATIVE
Neisseria Gonorrhea: NEGATIVE

## 2021-04-04 LAB — COMPREHENSIVE METABOLIC PANEL
ALT: 13 IU/L (ref 0–32)
AST: 18 IU/L (ref 0–40)
Albumin/Globulin Ratio: 2 (ref 1.2–2.2)
Albumin: 4.1 g/dL (ref 3.9–5.0)
Alkaline Phosphatase: 39 IU/L — ABNORMAL LOW (ref 44–121)
BUN/Creatinine Ratio: 22 (ref 9–23)
BUN: 14 mg/dL (ref 6–20)
Bilirubin Total: 0.2 mg/dL (ref 0.0–1.2)
CO2: 22 mmol/L (ref 20–29)
Calcium: 9.5 mg/dL (ref 8.7–10.2)
Chloride: 102 mmol/L (ref 96–106)
Creatinine, Ser: 0.63 mg/dL (ref 0.57–1.00)
Globulin, Total: 2.1 g/dL (ref 1.5–4.5)
Glucose: 75 mg/dL (ref 70–99)
Potassium: 4.1 mmol/L (ref 3.5–5.2)
Sodium: 138 mmol/L (ref 134–144)
Total Protein: 6.2 g/dL (ref 6.0–8.5)
eGFR: 125 mL/min/{1.73_m2} (ref >59)

## 2021-04-04 LAB — HCV INTERPRETATION

## 2021-04-04 LAB — MAGNESIUM: Magnesium: 2 mg/dL (ref 1.6–2.3)

## 2021-04-04 NOTE — Progress Notes (Signed)
DATING AND VIABILITY SONOGRAM   Terresa Marlett is a 25 y.o. year old G2P1001 with LMP Patient's last menstrual period was 01/10/2021. which would correlate to  [redacted]w[redacted]d weeks gestation.  She has regular menstrual cycles.   She is here today for a confirmatory initial sonogram.    GESTATION: SINGLETON     FETAL ACTIVITY:          Heart rate         154 bpm          The fetus is active.   ADNEXA: The ovaries are normal.   GESTATIONAL AGE AND  BIOMETRICS:  Gestational criteria: Estimated Date of Delivery: 10/24/21 by early ultrasound now at [redacted]w[redacted]d  Previous Scans:0      CROWN RUMP LENGTH         4.06 cm         10.6 weeks     4.24 cm    3.72                                                                        AVERAGE EGA(BY THIS SCAN):  10-6 weeks  WORKING EDD( early ultrasound ):  10/24/2021     TECHNICIAN COMMENTS:  Patient informed that the ultrasound is considered a limited obstetric ultrasound and is not intended to be a complete ultrasound exam.  Patient also informed that the ultrasound is not being completed with the intent of assessing for fetal or placental anomalies or any pelvic abnormalities. Explained that the purpose of today's ultrasound is to assess for fetal heart rate.  Patient acknowledges the purpose of the exam and the limitations of the study.     Armandina Stammer 04/04/2021 9:37 AM

## 2021-04-05 LAB — URINE CULTURE, OB REFLEX

## 2021-04-05 LAB — CULTURE, OB URINE

## 2021-04-08 ENCOUNTER — Encounter: Payer: Self-pay | Admitting: Family Medicine

## 2021-04-11 ENCOUNTER — Encounter: Payer: Self-pay | Admitting: Family Medicine

## 2021-04-16 ENCOUNTER — Encounter: Payer: Self-pay | Admitting: Family Medicine

## 2021-04-18 ENCOUNTER — Encounter: Payer: Self-pay | Admitting: Family Medicine

## 2021-04-18 DIAGNOSIS — O285 Abnormal chromosomal and genetic finding on antenatal screening of mother: Secondary | ICD-10-CM

## 2021-04-19 ENCOUNTER — Encounter: Payer: Self-pay | Admitting: Family Medicine

## 2021-04-19 NOTE — Telephone Encounter (Signed)
Discussed results of Panorama screening -  Low risk for trisomy, but unable to result for sex chromosomes due to atypical finding. Would like early [place] and genetic screening. Will get done urgently.

## 2021-04-21 NOTE — L&D Delivery Note (Signed)
   Delivery Note:   G2P1001 at [redacted]w[redacted]d  Admitting diagnosis: Encounter for induction of labor [Z34.90] Risks: N/a   First Stage:  Induction of labor: 10/21/21 @ 0754 Onset of labor: 10/21/21 @ 1325 Augmentation: AROM and Pitocin ROM: AROM clear fluid @ 1325 Active labor onset: @ 1325 Analgesia /Anesthesia/Pain control intrapartum: Epidural   Second Stage:  Complete dilation at 10/21/2021  1746 Onset of pushing at 1752pm  FHR second stage Cat II. Baseline prior to pushing was 130 with moderate variability. During pushing FHT 110-115 with moderate variability.    CNM called to bedside, patient complete and + 2 station. Patient began pushing in lithotomy position. FHT dropped down to 110 and sustained despite scalp stimulation. With great maternal pushing efforts fetal head crowned up to +4 station and FHT dropped down to 90's. Patient turned to right side lying and FHR climbed back to low 100's - 110. Patient Pushing in right side lying position with CNM and L&D staff support at bedside. With next contraction fetal head delivered with ease.  FOB present for birth and supportive.   Delivery of a Live born female  Birth Weight: 3520g 7lbs 12.2oz.   APGAR: 8, 9  Newborn Delivery   Birth date/time: 10/21/2021 18:12:00 Delivery type: Vaginal, Spontaneous     Fetal head delivered in cephalic presentation, Direct OA position and restituted  to LOA. With maternal pushing efforts, posterior arm released and remaining fetal body delivered without difficulty. Vigorously Crying infant placed immediately skin to skin with mother for drying and stimulation.   Nuchal Cord: Yes , x1 reduced with ease.   After 7 mins of life cord double clamped after cessation of pulsation, and cut by FOB.  Collection of cord blood for typing completed. Cord blood donation-None  Arterial cord blood sample-No    Third Stage:  With gentle cord tractions and LUS massage, Placenta delivered intact and -Spontaneous   with 3 vessels . Uterine tone firm bleeding scant. Uterotonics: IV pitocin bolus initiated.  Placenta to L&D for disposal.  2nd degree;Periurethral  laceration identified.  Episiotomy:None  Local analgesia: N/A   Repair: 2nd degree perineal repaired with 3.0 vicryl in usual fashion.  Bilateral periurethral identified. Right hemostatic- not repaired. Left side repaired with 4.0 Monocryl.  Est. Blood Loss (mL):75.00   Complications: None   Mom to postpartum.  Baby Boy "[place]" to Couplet care / Skin to Skin.  Delivery Report:  Review the Delivery Report for details.    Hunter Bachar Danella Deis) Suzie Portela, MSN, CNM  [facility]  10/21/21 6:50 PM

## 2021-04-23 ENCOUNTER — Encounter: Payer: Self-pay | Admitting: Family Medicine

## 2021-04-23 ENCOUNTER — Other Ambulatory Visit: Payer: Self-pay

## 2021-04-23 DIAGNOSIS — O285 Abnormal chromosomal and genetic finding on antenatal screening of mother: Secondary | ICD-10-CM

## 2021-04-24 ENCOUNTER — Encounter: Payer: Medicaid Other | Admitting: Family Medicine

## 2021-04-26 ENCOUNTER — Ambulatory Visit: Payer: Medicaid Other | Admitting: *Deleted

## 2021-04-26 ENCOUNTER — Ambulatory Visit: Payer: Medicaid Other | Attending: Family Medicine

## 2021-04-26 ENCOUNTER — Other Ambulatory Visit: Payer: Self-pay

## 2021-04-26 ENCOUNTER — Ambulatory Visit (HOSPITAL_BASED_OUTPATIENT_CLINIC_OR_DEPARTMENT_OTHER): Payer: Medicaid Other

## 2021-04-26 VITALS — BP 110/63 | HR 82

## 2021-04-26 DIAGNOSIS — O285 Abnormal chromosomal and genetic finding on antenatal screening of mother: Secondary | ICD-10-CM

## 2021-04-26 DIAGNOSIS — Z148 Genetic carrier of other disease: Secondary | ICD-10-CM | POA: Insufficient documentation

## 2021-04-26 DIAGNOSIS — Z3A14 14 weeks gestation of pregnancy: Secondary | ICD-10-CM | POA: Diagnosis not present

## 2021-04-26 DIAGNOSIS — Z3480 Encounter for supervision of other normal pregnancy, unspecified trimester: Secondary | ICD-10-CM | POA: Diagnosis present

## 2021-04-26 DIAGNOSIS — D582 Other hemoglobinopathies: Secondary | ICD-10-CM

## 2021-04-26 NOTE — Progress Notes (Addendum)
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   EDC: 10/24/2021 LMP: 01/10/2021 Referring Provider:  Levie Heritage, DO  EGA: [redacted]w[redacted]d Genetic Counselor: Teena Dunk, MS, CGC  OB Hx: [id number] Date of Appointment: 04/26/2021  Accompanied by: Her reproductive partner, Danniel Grenz MRN [id number] Face to Face Time: 40 Minutes   Medical History:  This is Christina Reeves'Christina Reeves 2nd pregnancy. She has a living, healthy daughter. Reports she takes prenatal vitamins and hydroxyzine. Denies personal history of diabetes, high blood pressure, thyroid conditions, and seizures. Denies bleeding, infections, and fevers in this pregnancy. Denies using tobacco, alcohol, or street drugs in this pregnancy.   Family History: A pedigree was created and scanned into Epic under the Media tab. Maternal ethnicity reported as Caucasian and [place] and paternal ethnicity reported as Caucasian. Denies Ashkenazi Jewish ancestry. Family history not remarkable for consanguinity, individuals with birth defects, intellectual disability, autism spectrum disorder, multiple spontaneous abortions, still births, or unexplained neonatal death.     Genetic Counseling:   Atypical Non-Invasive Prenatal Screen Result. Christina Reeves previously completed Non-Invasive Prenatal Screening (NIPS) in this pregnancy (scanned into Epic under the Media tab). The result is low risk for Down syndrome, Trisomy 18, and Trisomy 13. The laboratory reports a suspected fetal (placental) finding outside the scope of the test involving the Y chromosome, which may include, but is not limited to, fetal mosaicism, fetal chromosome abnormality, or normal variation. Specifically, the laboratory reports that the SNP pattern was clear enough to determine that the fetal chromosome Y is involved; however, determination of fetal sex cannot be provided. Genetic counseling reviewed this result in detail with [facility]  and Christina Reeves and offered amniocentesis for fetal chromosome analysis and microarray studies. After hearing the information, Christina Reeves declined amniocentesis for prenatal diagnosis.  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 Carriers of Hemoglobin E trait usually do not have symptoms, although some will have very mild anemia that does not typically need treatment.  If Christina Reeves'Christina Reeves reproductive partner is found to also be a carrier for Hemoglobin E trait, there would be a 25% risk for their offspring together to be affected with Hemoglobin E Disease. People with Hemoglobin E Disease usually do not have symptoms other than sometimes having very mild anemia and more rarely, having a slightly enlarged spleen. People with Hemoglobin E Disease rarely need treatment.  If Christina Reeves'Christina Reeves reproductive partner is found to be a carrier for Sickle Cell Disease, there would be a 25% risk for their offspring together to be affected with Hemoglobin SE Disease, which is a mild form of Sickle Cell Disease. In Hemoglobin SE Disease, red blood cells have an abnormal sickle shape which leads to mild to moderate anemia. Symptoms are usually mild in childhood but may become more serious starting in adulthood. Some people with Hemoglobin SE Disease have episodes of pain when sickled red blood cells get stuck in small blood vessels; this occurs more often during pregnancy. Other symptoms of Hemoglobin SE Disease may include jaundice, enlarged spleen, increased infections, and/or damage to other organs. If Christina Reeves'Christina Reeves reproductive partner is found to be a carrier of Beta Thalassemia, there would be a 25% risk for their offspring together to be affected with Hemoglobin E/Beta Thalassemia. Individuals with Hemoglobin E/Beta Thalassemia typically have moderate to severe anemia that usually requires ongoing medical care which sometimes  includes repeated blood transfusions. Additional symptoms may include slow growth and development, jaundice, bone changes, and an enlarged spleen, liver, and/or heart. 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 Christina Reeves accepted carrier screening for Beta Hemoglobinopathies and his blood was drawn today.  Birth Defects. All babies have approximately a 3-5% risk for a birth defect and a majority of these defects cannot be detected through the screening or diagnostic testing listed below. Ultrasound may detect some birth defects, but it may not detect all birth defects. About half of pregnancies with Down syndrome do not show any soft markers on ultrasound. A normal ultrasound does not guarantee a healthy pregnancy.   Testing/Screening Options:   Amniocentesis. This procedure is available for prenatal diagnosis. Possible procedural difficulties and complications that can arise include maternal infection, cramping, bleeding, fluid leakage, and/or pregnancy loss. The risk for pregnancy loss with an amniocentesis is 1/500. Per the [employer] of Obstetricians and Gynecologists (ACOG) Practice Bulletin 162, all pregnant women should be offered prenatal assessment for aneuploidy by diagnostic testing regardless of maternal age or other risk factors. If indicated, genetic testing that could be ordered on an amniocentesis sample includes a fetal karyotype, fetal microarray, and testing for specific syndromes.  Carrier Screening. Per the ACOG Committee Opinion 691, if an individual is found to be a carrier for a specific condition, the individual'Christina Reeves reproductive partner should be offered testing in order to receive informed genetic counseling about potential reproductive outcomes. Genetic counseling offered [name]'Christina Reeves reproductive partner carrier screening for Beta Hemoglobinopathies given [name]'Christina Reeves carrier status.     Patient Plan:  Proceed with: Carrier screening for beta hemoglobinopathies for Christina Reeves. Genetic counseling had Christina Reeves complete [place]'Christina Reeves compassionate care application and the blood sample was sent out today.  Informed consent was obtained. All questions were answered.  Declined: Amniocentesis for prenatal diagnosis   Thank you for sharing in the care of [facility] with [place].  Please do not hesitate to contact us if you have any questions.  Teena Dunk, MS, [facility]

## 2021-04-29 ENCOUNTER — Other Ambulatory Visit: Payer: Self-pay | Admitting: *Deleted

## 2021-04-29 DIAGNOSIS — O280 Abnormal hematological finding on antenatal screening of mother: Secondary | ICD-10-CM

## 2021-04-30 ENCOUNTER — Ambulatory Visit: Payer: Self-pay

## 2021-05-01 ENCOUNTER — Encounter: Payer: Medicaid Other | Admitting: Family Medicine

## 2021-05-02 ENCOUNTER — Ambulatory Visit (INDEPENDENT_AMBULATORY_CARE_PROVIDER_SITE_OTHER): Payer: Medicaid Other | Admitting: Obstetrics & Gynecology

## 2021-05-02 ENCOUNTER — Encounter: Payer: Medicaid Other | Admitting: Obstetrics & Gynecology

## 2021-05-02 ENCOUNTER — Other Ambulatory Visit: Payer: Self-pay

## 2021-05-02 VITALS — BP 103/65 | HR 82 | Wt 115.0 lb

## 2021-05-02 DIAGNOSIS — Z3480 Encounter for supervision of other normal pregnancy, unspecified trimester: Secondary | ICD-10-CM

## 2021-05-02 NOTE — Progress Notes (Signed)
° °  PRENATAL VISIT NOTE  Subjective:  Christina Reeves is a 26 y.o. G2P1001 at [redacted]w[redacted]d being seen today for ongoing prenatal care.  She is currently monitored for the following issues for this low-risk pregnancy and has Patient underweight; Lactose intolerance; Genetic carrier status; and Supervision of other normal pregnancy, antepartum on their problem list.  Patient reports no complaints.  Contractions: Not present. Vag. Bleeding: None.  Movement: Absent. Denies leaking of fluid.   The following portions of the patient's history were reviewed and updated as appropriate: allergies, current medications, past family history, past medical history, past social history, past surgical history and problem list.   Objective:   Vitals:   05/02/21 1537  BP: 103/65  Pulse: 82  Weight: 115 lb (52.2 kg)    Fetal Status: Fetal Heart Rate (bpm): 140   Movement: Absent     General:  Alert, oriented and cooperative. Patient is in no acute distress.  Skin: Skin is warm and dry. No rash noted.   Cardiovascular: Normal heart rate noted  Respiratory: Normal respiratory effort, no problems with respiration noted  Abdomen: Soft, gravid, appropriate for gestational age.  Pain/Pressure: Absent     Pelvic: Cervical exam deferred        Extremities: Normal range of motion.  Edema: None  Mental Status: Normal mood and affect. Normal behavior. Normal judgment and thought content.   Assessment and Plan:  Pregnancy: G2P1001 at [redacted]w[redacted]d 1. Supervision of other normal pregnancy, antepartum Declines AFP Sees MFM for detailed [place]; would like to skip the 20 week visit since she will be at MFM around that time.  She has BP cuff and Baby Rx on her phone.  2.  Underweight--pt has gained 5 pounds since beginning of pregnancy.  She gained 30 pounds with last pregnancy.  We will track this remotely in Baby Rx.    Preterm labor symptoms and general obstetric precautions including but not limited to vaginal bleeding,  contractions, leaking of fluid and fetal movement were reviewed in detail with the patient. Please refer to After Visit Summary for other counseling recommendations.   No follow-ups on file.  Future Appointments  Date Time Provider Department Center  05/29/2021  8:15 AM Levie Heritage, DO [facility] None  05/30/2021  7:45 AM [facility] NURSE [facility]  05/30/2021  8:00 AM [facility] US1 [facility]    Elsie Lincoln, MD

## 2021-05-03 ENCOUNTER — Encounter: Payer: Self-pay | Admitting: Family Medicine

## 2021-05-09 ENCOUNTER — Telehealth: Payer: Self-pay | Admitting: Obstetrics and Gynecology

## 2021-05-09 NOTE — Telephone Encounter (Signed)
I spoke with Mr. Blystone ([phone]) to let him know that the results of this testing for changes in the Beta Hemoglobin (HBB) gene was normal.  This means that no variants were identified in this gene.  Testing was performed because his reproductive partner, Natacha Jepsen, is currently pregnant and was found to be a carrier for Hemoglobin E trait.  Given his normal results, their pregnancy is not expected to be at risk for a clinically significant hemoglobin condition.  The baby may be a carrier for Hemoglobin E like Ms. [name], which is not expected to be harmful. Please see the genetic counseling note from Analyssa Tallas for details.  We may be reached at [phone] with any questions or concerns. Mr. Chismar stated that he would speak with Ms. [name] about these results and call us if needed.  Cherly Anderson, MS, CGC

## 2021-05-29 ENCOUNTER — Encounter: Payer: Medicaid Other | Admitting: Family Medicine

## 2021-05-30 ENCOUNTER — Other Ambulatory Visit: Payer: Self-pay

## 2021-05-30 ENCOUNTER — Encounter: Payer: Self-pay | Admitting: *Deleted

## 2021-05-30 ENCOUNTER — Ambulatory Visit: Payer: Medicaid Other | Admitting: *Deleted

## 2021-05-30 ENCOUNTER — Other Ambulatory Visit: Payer: Self-pay | Admitting: *Deleted

## 2021-05-30 ENCOUNTER — Ambulatory Visit: Payer: Medicaid Other | Attending: Family Medicine

## 2021-05-30 VITALS — BP 104/68 | HR 67

## 2021-05-30 DIAGNOSIS — Z3689 Encounter for other specified antenatal screening: Secondary | ICD-10-CM

## 2021-05-30 DIAGNOSIS — O280 Abnormal hematological finding on antenatal screening of mother: Secondary | ICD-10-CM

## 2021-05-30 DIAGNOSIS — O35BXX0 Maternal care for other (suspected) fetal abnormality and damage, fetal cardiac anomalies, not applicable or unspecified: Secondary | ICD-10-CM

## 2021-05-30 DIAGNOSIS — Z3A19 19 weeks gestation of pregnancy: Secondary | ICD-10-CM

## 2021-05-30 DIAGNOSIS — Z362 Encounter for other antenatal screening follow-up: Secondary | ICD-10-CM

## 2021-06-05 ENCOUNTER — Encounter: Payer: Self-pay | Admitting: Family Medicine

## 2021-06-12 ENCOUNTER — Telehealth (INDEPENDENT_AMBULATORY_CARE_PROVIDER_SITE_OTHER): Payer: Medicaid Other | Admitting: Family Medicine

## 2021-06-12 VITALS — Wt 124.0 lb

## 2021-06-12 DIAGNOSIS — Z3482 Encounter for supervision of other normal pregnancy, second trimester: Secondary | ICD-10-CM

## 2021-06-12 DIAGNOSIS — Z3A20 20 weeks gestation of pregnancy: Secondary | ICD-10-CM

## 2021-06-12 DIAGNOSIS — Z3480 Encounter for supervision of other normal pregnancy, unspecified trimester: Secondary | ICD-10-CM

## 2021-06-12 NOTE — Progress Notes (Signed)
OBSTETRICS PRENATAL VIRTUAL VISIT ENCOUNTER NOTE  Provider location: Center for [place] at [facility]   Patient location: Home  I connected with Christina Reeves on 06/12/21 at  8:15 AM EST by MyChart Video Encounter and verified that I am speaking with the correct person using two identifiers. I discussed the limitations, risks, security and privacy concerns of performing an evaluation and management service virtually and the availability of in person appointments. I also discussed with the patient that there may be a patient responsible charge related to this service. The patient expressed understanding and agreed to proceed. Subjective:  Christina Reeves is a 26 y.o. G2P1001 at [redacted]w[redacted]d being seen today for ongoing prenatal care.  She is currently monitored for the following issues for this low-risk pregnancy and has Patient underweight; Lactose intolerance; Genetic carrier status; and Supervision of other normal pregnancy, antepartum on their problem list.  Patient reports no complaints.  Contractions: Not present. Vag. Bleeding: None.  Movement: Present. Denies any leaking of fluid.   The following portions of the patient's history were reviewed and updated as appropriate: allergies, current medications, past family history, past medical history, past social history, past surgical history and problem list.   Objective:   Vitals:   06/12/21 0813  Weight: 124 lb (56.2 kg)    Fetal Status:     Movement: Present     General:  Alert, oriented and cooperative. Patient is in no acute distress.  Respiratory: Normal respiratory effort, no problems with respiration noted  Mental Status: Normal mood and affect. Normal behavior. Normal judgment and thought content.  Rest of physical exam deferred due to type of encounter  Imaging: [place] MFM OB DETAIL +14 WK  Result Date: 05/30/2021 ----------------------------------------------------------------------  OBSTETRICS REPORT                        (Signed Final 05/30/2021 09:10 am) ---------------------------------------------------------------------- Patient Info  ID #:       [id number]                          D.O.B.:  May 24, 1995 (26 yrs)  Name:       Christina Reeves                  Visit Date: 05/30/2021 07:53 am ---------------------------------------------------------------------- Performed By  Attending:        Tama High MD        Ref. Address:     [place]  Performed By:     Germain Osgood            [facility]  Referred By:      [facility] ---------------------------------------------------------------------- Orders  #  Description                           Code        Ordered By  1  [place] MFM OB DETAIL +14 WK               [id number]    Rosana Hoes ----------------------------------------------------------------------  #  Order #                     Accession #                Episode #  1  [id number]                   [id number]                 [id number] ---------------------------------------------------------------------- Indications  Abnormal finding on antenatal screening        O28.9  (NIPS - atypical findings on sex  chromosomes)  Echogenic intracardiac focus of the heart      O35.8XX0  (EIF)  Genetic carrier (Hgb E)                        Z14.8  [redacted] weeks gestation of pregnancy                Z3A.19 ---------------------------------------------------------------------- Fetal Evaluation  Num Of Fetuses:         1  Fetal Heart Rate(bpm):  140  Cardiac Activity:       Observed  Presentation:           Cephalic  Placenta:               Anterior  P. Cord Insertion:      Visualized, central  Amniotic Fluid  AFI FV:      Within normal  limits                              Largest Pocket(cm)                              4.35 ---------------------------------------------------------------------- Biometry  BPD:      45.7  mm     G. Age:  19w 6d         82  %    CI:        72.88   %    70 - 86                                                          FL/HC:      16.9   %    16.1 - 18.3  HC:  170.2  mm     G. Age:  19w 4d         73  %    HC/AC:      1.21        1.09 - 1.39  AC:      141.2  mm     G. Age:  19w 3d         44  %    FL/BPD:     63.0   %  FL:       28.8  mm     G. Age:  18w 6d         37  %    FL/AC:      20.4   %    20 - 24  HUM:      25.6  mm     G. Age:  18w 0d         23  %  CER:        21  mm     G. Age:  20w 0d         84  %  NFT:       4.2  mm  LV:        7.3  mm  CM:        3.3  mm  Est. FW:     282  gm    0 lb 10 oz      61  % ---------------------------------------------------------------------- OB History  Gravidity:    2`        Term:   1        Prem:   0        SAB:   0  TOP:          0       Ectopic:  0        Living: 1 ---------------------------------------------------------------------- Gestational Age  LMP:           20w 0d        Date:  01/10/21                 EDD:   10/17/21  U/S Today:     19w 3d                                        EDD:   10/21/21  Best:          19w 0d     Det. By:  Loman Chroman         EDD:   10/24/21                                      (04/03/21) ---------------------------------------------------------------------- Anatomy  Cranium:               Appears normal         Aortic Arch:            Appears normal  Cavum:                 Appears normal         Ductal Arch:            Appears normal  Ventricles:            Appears  normal         Diaphragm:              Appears normal  Choroid Plexus:        Appears normal         Stomach:                Appears normal, left                                                                        sided  Cerebellum:            Appears normal          Abdomen:                Appears normal  Posterior Fossa:       Appears normal         Abdominal Wall:         Appears nml (cord                                                                        insert, abd wall)  Nuchal Fold:           Appears normal         Cord Vessels:           Appears normal (3                                                                        vessel cord)  Face:                  Appears normal         Kidneys:                Appear normal                         (orbits and profile)  Lips:                  Appears normal         Bladder:                Appears normal  Thoracic:              Appears normal         Spine:                  Ltd views no  intracranial signs of                                                                        NTD  Heart:                 Appears normal; EIF    Upper Extremities:      Appears normal  RVOT:                  Appears normal         Lower Extremities:      Appears normal  LVOT:                  Appears normal  Other:  Fetus appears to be a female. Heels/feet and open hands/5th digits          visualized. Nasal bone and lenses visualized. ---------------------------------------------------------------------- Cervix Uterus Adnexa  Cervix  Length:           3.67  cm.  Not visualized (advanced GA >24wks)  Uterus  No abnormality visualized.  Right Ovary  Not visualized.  Left Ovary  Within normal limits.  Cul De Sac  No free fluid seen.  Adnexa  No adnexal mass visualized. ---------------------------------------------------------------------- Impression  G2 P1.  Patient returned for fetal anatomy scan.  Cell free  fetal DNA screening showed atypical finding on sex  chromosomes.  Subsequently, the couple met with our  genetic counselor and opted not to have amniocentesis.  Obstetrical history is significant for a term vaginal delivery.  We performed fetal anatomy scan. An  echogenic intracardiac  focus is seen. No other makers of aneuploidies or fetal  structural defects are seen.  Fetal sex is female.  Fetal biometry  is consistent with her previously-established dates. Amniotic  fluid is normal and good fetal activity is seen.  I informed the patient that given that she had low rik for fetal  aneuploidies on cell-free fetal DNA screening, finding of  echogenic intracardiac focus should be considered a normal  variant and that the risk of trisomy 21 is not increased. I also  reassured that echogenic focus does not increase the risk of  cardiac defects. I also informed her that only amniocentesis  will give a defintive result on the fetal karyotype.  Since this is  a normal variant, I did not recommend amniocentesis to rule  out Down syndrome.  Patient opted not to have amniocentesis. ---------------------------------------------------------------------- Recommendations  -An appointment was made for her to return in 4 weeks for  completion of fetal anatomy. ----------------------------------------------------------------------                  Tama High, MD Electronically Signed Final Report   05/30/2021 09:10 am ----------------------------------------------------------------------   Assessment and Plan:  Pregnancy: G2P1001 at [redacted]w[redacted]d 1. Supervision of other normal pregnancy, antepartum Good fetal movement. [place] had limited views of spine, but otherwise normal.   Preterm labor symptoms and general obstetric precautions including but not limited to vaginal bleeding, contractions, leaking of fluid and fetal movement were reviewed in detail with the patient. I discussed the assessment and treatment plan with the patient. The patient was provided an opportunity to ask questions and all were answered. The patient  agreed with the plan and demonstrated an understanding of the instructions. The patient was advised to call back or seek an in-person office evaluation/go to [facility] at [facility] for any urgent or concerning symptoms. Please refer to After Visit Summary for other counseling recommendations.   I provided 10 minutes of face-to-face time during this encounter.  No follow-ups on file.  Future Appointments  Date Time Provider [place]  06/27/2021  3:30 PM [facility] NURSE [facility]  06/27/2021  3:45 PM [facility] US4 [facility]  08/07/2021  8:35 AM Nehemiah Settle, Tanna Savoy, DO [facility] None    [place] for [employer], [place]

## 2021-06-12 NOTE — Progress Notes (Signed)
Pt did not take BP. Pt is at work and doesn't have cuff with her. Pt will take BP when she gets home from work and send through [employer].

## 2021-06-27 ENCOUNTER — Ambulatory Visit: Payer: Medicaid Other | Attending: Obstetrics and Gynecology

## 2021-06-27 ENCOUNTER — Other Ambulatory Visit: Payer: Self-pay

## 2021-06-27 ENCOUNTER — Ambulatory Visit: Payer: Medicaid Other | Admitting: *Deleted

## 2021-06-27 VITALS — BP 108/59 | HR 80

## 2021-06-27 DIAGNOSIS — Z3480 Encounter for supervision of other normal pregnancy, unspecified trimester: Secondary | ICD-10-CM | POA: Insufficient documentation

## 2021-06-27 DIAGNOSIS — O285 Abnormal chromosomal and genetic finding on antenatal screening of mother: Secondary | ICD-10-CM

## 2021-06-27 DIAGNOSIS — Z3689 Encounter for other specified antenatal screening: Secondary | ICD-10-CM | POA: Insufficient documentation

## 2021-06-27 DIAGNOSIS — O280 Abnormal hematological finding on antenatal screening of mother: Secondary | ICD-10-CM | POA: Diagnosis not present

## 2021-06-27 DIAGNOSIS — Z362 Encounter for other antenatal screening follow-up: Secondary | ICD-10-CM

## 2021-06-27 DIAGNOSIS — O358XX0 Maternal care for other (suspected) fetal abnormality and damage, not applicable or unspecified: Secondary | ICD-10-CM | POA: Insufficient documentation

## 2021-06-27 DIAGNOSIS — Z3A23 23 weeks gestation of pregnancy: Secondary | ICD-10-CM | POA: Insufficient documentation

## 2021-06-27 DIAGNOSIS — Z148 Genetic carrier of other disease: Secondary | ICD-10-CM | POA: Insufficient documentation

## 2021-06-27 DIAGNOSIS — O289 Unspecified abnormal findings on antenatal screening of mother: Secondary | ICD-10-CM | POA: Insufficient documentation

## 2021-06-28 ENCOUNTER — Other Ambulatory Visit: Payer: Self-pay | Admitting: *Deleted

## 2021-06-28 DIAGNOSIS — O283 Abnormal ultrasonic finding on antenatal screening of mother: Secondary | ICD-10-CM

## 2021-06-28 DIAGNOSIS — O280 Abnormal hematological finding on antenatal screening of mother: Secondary | ICD-10-CM

## 2021-07-02 ENCOUNTER — Other Ambulatory Visit: Payer: Self-pay | Admitting: *Deleted

## 2021-07-02 DIAGNOSIS — O280 Abnormal hematological finding on antenatal screening of mother: Secondary | ICD-10-CM

## 2021-07-03 ENCOUNTER — Other Ambulatory Visit: Payer: Self-pay | Admitting: *Deleted

## 2021-07-03 ENCOUNTER — Other Ambulatory Visit: Payer: Self-pay | Admitting: Obstetrics and Gynecology

## 2021-07-03 DIAGNOSIS — O283 Abnormal ultrasonic finding on antenatal screening of mother: Secondary | ICD-10-CM

## 2021-07-03 DIAGNOSIS — Z3A24 24 weeks gestation of pregnancy: Secondary | ICD-10-CM

## 2021-07-03 DIAGNOSIS — Q998 Other specified chromosome abnormalities: Secondary | ICD-10-CM

## 2021-07-03 DIAGNOSIS — Z362 Encounter for other antenatal screening follow-up: Secondary | ICD-10-CM

## 2021-07-04 ENCOUNTER — Other Ambulatory Visit: Payer: Self-pay | Admitting: Obstetrics and Gynecology

## 2021-07-04 ENCOUNTER — Ambulatory Visit: Payer: Medicaid Other | Admitting: *Deleted

## 2021-07-04 ENCOUNTER — Other Ambulatory Visit: Payer: Self-pay

## 2021-07-04 ENCOUNTER — Ambulatory Visit: Payer: Medicaid Other

## 2021-07-04 ENCOUNTER — Encounter: Payer: Self-pay | Admitting: *Deleted

## 2021-07-04 ENCOUNTER — Ambulatory Visit: Payer: Medicaid Other | Attending: Obstetrics and Gynecology

## 2021-07-04 VITALS — BP 108/66 | HR 78

## 2021-07-04 DIAGNOSIS — Z3A24 24 weeks gestation of pregnancy: Secondary | ICD-10-CM | POA: Diagnosis not present

## 2021-07-04 DIAGNOSIS — Z362 Encounter for other antenatal screening follow-up: Secondary | ICD-10-CM | POA: Insufficient documentation

## 2021-07-04 DIAGNOSIS — O283 Abnormal ultrasonic finding on antenatal screening of mother: Secondary | ICD-10-CM | POA: Diagnosis not present

## 2021-07-04 DIAGNOSIS — O358XX0 Maternal care for other (suspected) fetal abnormality and damage, not applicable or unspecified: Secondary | ICD-10-CM

## 2021-07-04 NOTE — Addendum Note (Signed)
Addended by: Teena Dunk on: 07/04/2021 11:35 AM ? ? Modules accepted: Orders ? ?

## 2021-07-05 ENCOUNTER — Telehealth: Payer: Self-pay | Admitting: *Deleted

## 2021-07-08 ENCOUNTER — Telehealth: Payer: Self-pay

## 2021-07-08 NOTE — Telephone Encounter (Signed)
Checked with patient's insurance for prior authorization information. Authorization is only required for [id number].  ? ?Submitted fax form to [employer] and am waiting on a determination.  ?

## 2021-07-12 ENCOUNTER — Encounter: Payer: Self-pay | Admitting: Family Medicine

## 2021-07-15 ENCOUNTER — Telehealth: Payer: Self-pay | Admitting: Genetics

## 2021-07-15 NOTE — Telephone Encounter (Signed)
Called Christina Reeves to return amniocentesis karyotype results. Left voicemail with [facility] call back number. ?

## 2021-07-15 NOTE — Telephone Encounter (Signed)
Christina Reeves returned genetic counseling's phone call. We reviewed that the amniotic fluid karyotype is normal (46,XY). The amniotic fluid microarray is still pending. All questions answered.  ?

## 2021-07-17 ENCOUNTER — Telehealth: Payer: Self-pay | Admitting: Genetics

## 2021-07-17 LAB — CHROMOSOME, AMNIOTIC FLUID
Cells Analyzed: 15
Cells Counted: 15
Cells Karyotyped: 2
Colonies: 15
GTG Band Resolution Achieved: 450

## 2021-07-17 LAB — MATERNAL CELL CONTAMINATION

## 2021-07-17 LAB — DIRECT PRENATAL SNP CMA

## 2021-07-17 NOTE — Telephone Encounter (Signed)
Called Silka to return amniocentesis microarray results. Left voicemail with [facility] call back number. ? ?

## 2021-07-17 NOTE — Telephone Encounter (Signed)
Oriel returned genetic counseling's phone call. We reviewed that the fetal microarray is normal. We offered Jessly the option of whole exome sequencing through the [facility] research study. Meekah stated she will contact genetic counseling directly if she is interested in participating in the [facility] research study. All questions answered.  ?

## 2021-07-24 ENCOUNTER — Encounter: Payer: Self-pay | Admitting: Family Medicine

## 2021-07-25 ENCOUNTER — Ambulatory Visit: Payer: Medicaid Other

## 2021-07-25 ENCOUNTER — Encounter: Payer: Self-pay | Admitting: Family Medicine

## 2021-07-25 ENCOUNTER — Encounter: Payer: Self-pay | Admitting: General Practice

## 2021-08-07 ENCOUNTER — Ambulatory Visit (INDEPENDENT_AMBULATORY_CARE_PROVIDER_SITE_OTHER): Payer: Medicaid Other | Admitting: Family Medicine

## 2021-08-07 ENCOUNTER — Ambulatory Visit: Payer: Medicaid Other | Admitting: *Deleted

## 2021-08-07 ENCOUNTER — Encounter: Payer: Self-pay | Admitting: General Practice

## 2021-08-07 ENCOUNTER — Ambulatory Visit: Payer: Medicaid Other | Attending: Obstetrics and Gynecology

## 2021-08-07 VITALS — BP 102/64 | HR 68 | Wt 132.0 lb

## 2021-08-07 VITALS — BP 100/59 | HR 85

## 2021-08-07 DIAGNOSIS — O285 Abnormal chromosomal and genetic finding on antenatal screening of mother: Secondary | ICD-10-CM | POA: Insufficient documentation

## 2021-08-07 DIAGNOSIS — O280 Abnormal hematological finding on antenatal screening of mother: Secondary | ICD-10-CM | POA: Diagnosis not present

## 2021-08-07 DIAGNOSIS — Z3480 Encounter for supervision of other normal pregnancy, unspecified trimester: Secondary | ICD-10-CM

## 2021-08-07 DIAGNOSIS — O358XX0 Maternal care for other (suspected) fetal abnormality and damage, not applicable or unspecified: Secondary | ICD-10-CM

## 2021-08-07 DIAGNOSIS — Z148 Genetic carrier of other disease: Secondary | ICD-10-CM

## 2021-08-07 DIAGNOSIS — O283 Abnormal ultrasonic finding on antenatal screening of mother: Secondary | ICD-10-CM

## 2021-08-07 DIAGNOSIS — Z362 Encounter for other antenatal screening follow-up: Secondary | ICD-10-CM | POA: Diagnosis not present

## 2021-08-07 DIAGNOSIS — Z3A28 28 weeks gestation of pregnancy: Secondary | ICD-10-CM

## 2021-08-07 NOTE — Progress Notes (Signed)
? ?  PRENATAL VISIT NOTE ? ?Subjective:  ?Christina Reeves is a 26 y.o. G2P1001 at [redacted]w[redacted]d being seen today for ongoing prenatal care.  She is currently monitored for the following issues for this high-risk pregnancy and has Patient underweight; Lactose intolerance; Genetic carrier status; Supervision of other normal pregnancy, antepartum; and Abnormal pregnancy [place] on their problem list. ? ?Patient reports no complaints.  Contractions: Not present. Vag. Bleeding: None.  Movement: Present. Denies leaking of fluid.  ? ?The following portions of the patient's history were reviewed and updated as appropriate: allergies, current medications, past family history, past medical history, past social history, past surgical history and problem list.  ? ?Objective:  ? ?Vitals:  ? 08/07/21 0851  ?BP: 102/64  ?Pulse: 68  ?Weight: 132 lb (59.9 kg)  ? ? ?Fetal Status: Fetal Heart Rate (bpm): 130   Movement: Present    ? ?General:  Alert, oriented and cooperative. Patient is in no acute distress.  ?Skin: Skin is warm and dry. No rash noted.   ?Cardiovascular: Normal heart rate noted  ?Respiratory: Normal respiratory effort, no problems with respiration noted  ?Abdomen: Soft, gravid, appropriate for gestational age.  Pain/Pressure: Present     ?Pelvic: Cervical exam deferred        ?Extremities: Normal range of motion.  Edema: None  ?Mental Status: Normal mood and affect. Normal behavior. Normal judgment and thought content.  ? ?Assessment and Plan:  ?Pregnancy: G2P1001 at [redacted]w[redacted]d ?1. Supervision of other normal pregnancy, antepartum ?Had amniocentesis due to abnormal NIPS - normal karotype and normal microarray.  ?- Glucose Tolerance, 2 Hours w/1 Hour ?- HIV antibody (with reflex) ?- CBC ?- RPR ? ?2. 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 Okay to deliver at [facility]. Will need echo post delivery ?Has [place] for growth today. ? ?Preterm labor symptoms and general obstetric  precautions including but not limited to vaginal bleeding, contractions, leaking of fluid and fetal movement were reviewed in detail with the patient. ?Please refer to After Visit Summary for other counseling recommendations.  ? ?No follow-ups on file. ? ?Future Appointments  ?Date Time Provider [place]  ?08/07/2021  3:15 PM [facility] NURSE [facility]  ?08/07/2021  3:30 PM [facility] US3 [facility] [place]  ?09/04/2021 11:15 AM [name], DO [facility] None  ?10/02/2021 11:15 AM [name], DO [facility] None  ? ? ?[name], DO ?

## 2021-08-08 LAB — CBC
Hematocrit: 31.5 % — ABNORMAL LOW (ref 34.0–46.6)
Hemoglobin: 10.2 g/dL — ABNORMAL LOW (ref 11.1–15.9)
MCH: 25.6 pg — ABNORMAL LOW (ref 26.6–33.0)
MCHC: 32.4 g/dL (ref 31.5–35.7)
MCV: 79 fL (ref 79–97)
Platelets: 198 10*3/uL (ref 150–450)
RBC: 3.99 x10E6/uL (ref 3.77–5.28)
RDW: 14.5 % (ref 11.7–15.4)
WBC: 7.5 10*3/uL (ref 3.4–10.8)

## 2021-08-08 LAB — RPR: RPR Ser Ql: NONREACTIVE

## 2021-08-08 LAB — HIV ANTIBODY (ROUTINE TESTING W REFLEX): HIV Screen 4th Generation wRfx: NONREACTIVE

## 2021-08-08 LAB — GLUCOSE TOLERANCE, 2 HOURS W/ 1HR
Glucose, 1 hour: 113 mg/dL (ref 70–179)
Glucose, 2 hour: 105 mg/dL (ref 70–152)
Glucose, Fasting: 63 mg/dL — ABNORMAL LOW (ref 70–91)

## 2021-08-22 ENCOUNTER — Encounter: Payer: Self-pay | Admitting: Family Medicine

## 2021-09-04 ENCOUNTER — Telehealth (INDEPENDENT_AMBULATORY_CARE_PROVIDER_SITE_OTHER): Payer: Medicaid Other | Admitting: Family Medicine

## 2021-09-04 VITALS — Wt 134.0 lb

## 2021-09-04 DIAGNOSIS — Z3A32 32 weeks gestation of pregnancy: Secondary | ICD-10-CM

## 2021-09-04 DIAGNOSIS — O352XX0 Maternal care for (suspected) hereditary disease in fetus, not applicable or unspecified: Secondary | ICD-10-CM

## 2021-09-04 DIAGNOSIS — Z148 Genetic carrier of other disease: Secondary | ICD-10-CM

## 2021-09-04 DIAGNOSIS — Z3480 Encounter for supervision of other normal pregnancy, unspecified trimester: Secondary | ICD-10-CM

## 2021-09-04 DIAGNOSIS — O283 Abnormal ultrasonic finding on antenatal screening of mother: Secondary | ICD-10-CM

## 2021-09-04 NOTE — Progress Notes (Signed)
? ?OBSTETRICS PRENATAL VIRTUAL VISIT ENCOUNTER NOTE ? ?Provider location: [facility] [employer] [facility]  ? ?Patient location: Home ? ?I connected with Christina Reeves on 09/04/21 at 11:15 AM EDT by MyChart Video Encounter and verified that I am speaking with the correct person using two identifiers. I discussed the limitations, risks, security and privacy concerns of performing an evaluation and management service virtually and the availability of in person appointments. I also discussed with the patient that there may be a patient responsible charge related to this service. The patient expressed understanding and agreed to proceed. ?Subjective:  ?Christina Reeves is a 26 y.o. G2P1001 at [redacted]w[redacted]d being seen today for ongoing prenatal care.  She is currently monitored for the following issues for this high-risk pregnancy and has Patient underweight; Lactose intolerance; Genetic carrier status; Supervision of other normal pregnancy, antepartum; and Abnormal pregnancy [place] on their problem list. ? ?Patient reports no complaints.  Contractions: Not present. Vag. Bleeding: None.  Movement: Present. Denies any leaking of fluid.  ? ?The following portions of the patient's history were reviewed and updated as appropriate: allergies, current medications, past family history, past medical history, past social history, past surgical history and problem list.  ? ?Objective:  ? ?Vitals:  ? 09/04/21 1054  ?Weight: 134 lb (60.8 kg)  ? ? ?Fetal Status:     Movement: Present    ? ?General:  Alert, oriented and cooperative. Patient is in no acute distress.  ?Respiratory: Normal respiratory effort, no problems with respiration noted  ?Mental Status: Normal mood and affect. Normal behavior. Normal judgment and thought content.  ?Rest of physical exam deferred due to type of encounter ? ?Imaging: ?[place] MFM OB FOLLOW UP ? ?Result Date: 08/07/2021 ?----------------------------------------------------------------------   OBSTETRICS REPORT                       (Signed Final 08/07/2021 04:04 pm) ---------------------------------------------------------------------- Patient Info  ID #:       [id number]                          D.O.B.:  03/31/1996 (25 yrs)  Name:       Christina Reeves                   Visit Date: 08/07/2021 03:19 pm ---------------------------------------------------------------------- Performed By  Attending:        Lin Landsman      Ref. Address:     [address]                    [address]                                                             [address]  Performed By:     Emeline Darling BS,      [facility]                                     [facility]  at                                                             [employer] for                                                             Women  Referred By:      [facility] ---------------------------------------------------------------------- Orders  #  Description                           Code        Ordered By  1  [place] MFM OB FOLLOW UP                   [phone]    Tama High ----------------------------------------------------------------------  #  Order #                     Accession #                Episode #  1  [id number]                   [id number]                 [id number] ---------------------------------------------------------------------- Indications  Abnormal fetal ultrasound (right aortic arch)  O28.9  Abnormal finding on antenatal screening        O28.9  (NIPS - atypical findings on sex  chromosomes)  Echogenic intracardiac focus of the heart      O35.8XX0  (EIF)  Genetic carrier (Hgb E)                        Z14.8  [redacted] weeks gestation of pregnancy                Z3A.28  Encounter for other antenatal screening        Z36.2  follow-up  Fetal ECHO ( ---------------------------------------------------------------------- Fetal Evaluation  Num Of Fetuses:         1  Fetal Heart Rate(bpm):  141  Cardiac  Activity:       Observed  Presentation:           Cephalic  Placenta:               Anterior  P. Cord Insertion:      Previously Visualized  Amniotic Fluid  AFI FV:      Within normal limits  AFI Sum(cm)     %Tile       Largest Pocket(cm)  13.4            41          5.8  RUQ(cm)       RLQ(cm)       LUQ(cm)        LLQ(cm)  2.6           5.8           2.7  2.3 ---------------------------------------------------------------------- Biometry  BPD:      76.8  mm     G. Age:  30w 6d         91  %    CI:        80.21   %    70 - 86                                                          FL/HC:      19.9   %    19.6 - 20.8  HC:      270.9  mm     G. Age:  29w 4d         37  %    HC/AC:      1.04        0.99 - 1.21  AC:      261.2  mm     G. Age:  30w 2d         83  %    FL/BPD:     70.1   %    71 - 87  FL:       53.8  mm     G. Age:  28w 4d         25  %    FL/AC:      20.6   %    20 - 24  Est. FW:    1434  gm      3 lb 3 oz     68  % ---------------------------------------------------------------------- OB History  Gravidity:    2`        Term:   1        Prem:   0        SAB:   0  TOP:          0       Ectopic:  0        Living: 1 ---------------------------------------------------------------------- Gestational Age  LMP:           29w 6d        Date:  01/10/21                 EDD:   10/17/21  U/S Today:     29w 6d                                        EDD:   10/17/21  Best:          28w 6d     Det. ByLoman Chroman         EDD:   10/24/21                                      (04/03/21) ---------------------------------------------------------------------- Anatomy  Cranium:               Appears normal         Aortic Arch:            Right sided aortic  arch  Cavum:                 Previously seen        Ductal Arch:            Previously seen  Ventricles:            Appears normal         Diaphragm:              Appears normal  Choroid  Plexus:        Previously seen        Stomach:                Appears normal, left                                                                        sided  Cerebellum:            Previously seen        Abdomen:                Appears normal  Posterior Fossa:       Previously seen        Abdominal Wall:         Previously seen  Nuchal Fold:           Not applicable ([id number]    Cord Vessels:           Previously seen                         wks GA)  Face:                  Orbits and profile     Kidneys:                Appear normal                         previously seen  Lips:                  Appears normal         Bladder:                Appears normal  Thoracic:              Appears normal         Spine:                  Previously seen  Heart:                 Appears normal; EIF    Upper Extremities:      Previously seen  RVOT:                  Appears normal         Lower Extremities:      Previously seen  LVOT:                  Appears normal  Other:  Fetus appears to be a female. Heels/feet, open hands/5th digits, nasal          bone  and lenses previously visualized. ---------------------------------------------------------------------- Cervix Uterus Adnexa  Cervix  Not visualized (advanced GA >24wks) ---------------------------------------------------------------------- Impression  Follow up growth due to right sided aortic arch and atypical  findings on cell free DNA (normal amniocentesis)  Normal interval growth with measurements consistent with  dates  Good fetal movement and amniotic fluid volume  Fetal echocardiogram ([facility]) performed and confirming  normal heart function and structure, besides right sided aortic  arch and left ductus arteriosus.  Delivery can occur at [facility], however postnatal  echocardiogram 1-2 months after birth was recommended. ---------------------------------------------------------------------- Recommendations  Follow up growth as clinically indicated.  ----------------------------------------------------------------------               Sander Nephew, MD Electronically Signed Final Report   08/07/2021 04:04 pm ----------------------------------------------------------------------

## 2021-09-18 ENCOUNTER — Encounter: Payer: Self-pay | Admitting: Family Medicine

## 2021-10-02 ENCOUNTER — Encounter: Payer: Self-pay | Admitting: Family Medicine

## 2021-10-02 ENCOUNTER — Encounter: Payer: Medicaid Other | Admitting: Family Medicine

## 2021-10-02 ENCOUNTER — Telehealth: Payer: Self-pay | Admitting: General Practice

## 2021-10-02 NOTE — Telephone Encounter (Signed)
Pt called this morning wanting to be seen earlier today due to child having an appt. Informed pt that we did not have anything earlier.  Pt thought that her appt was at 11:45am and pt was told that it was at 11:15am.  Offered to work pt in for tomorrow, 10/03/2021, Friday, 10/04/2021 morning, or some time next week.  Pt declined all options and stated that she was on vacation next week.  Instructed pt to keep appt that is scheduled on 10/16/2021 at 11:15am.  Pt was upset and hung up the phone.

## 2021-10-04 ENCOUNTER — Ambulatory Visit (INDEPENDENT_AMBULATORY_CARE_PROVIDER_SITE_OTHER): Payer: Medicaid Other | Admitting: Family Medicine

## 2021-10-04 ENCOUNTER — Other Ambulatory Visit (HOSPITAL_COMMUNITY)
Admission: RE | Admit: 2021-10-04 | Discharge: 2021-10-04 | Disposition: A | Discharge status: Home / Self Care | Payer: Medicaid Other | Source: Ambulatory Visit | Attending: Family Medicine | Admitting: Family Medicine

## 2021-10-04 VITALS — BP 108/70 | HR 75 | Wt 136.0 lb

## 2021-10-04 DIAGNOSIS — Z3480 Encounter for supervision of other normal pregnancy, unspecified trimester: Secondary | ICD-10-CM | POA: Insufficient documentation

## 2021-10-04 DIAGNOSIS — O283 Abnormal ultrasonic finding on antenatal screening of mother: Secondary | ICD-10-CM

## 2021-10-04 DIAGNOSIS — Z3A37 37 weeks gestation of pregnancy: Secondary | ICD-10-CM

## 2021-10-04 NOTE — Progress Notes (Signed)
   PRENATAL VISIT NOTE  Subjective:  Christina Reeves is a 26 y.o. G2P1001 at [redacted]w[redacted]d being seen today for ongoing prenatal care.  She is currently monitored for the following issues for this high-risk pregnancy and has Patient underweight; Lactose intolerance; Genetic carrier status; Supervision of other normal pregnancy, antepartum; and Abnormal pregnancy [place] on their problem list.  Patient reports  having some increased feelings of being on edge.  Denies depression.  Does have some increased anxiousness.  Has been on Vistaril in the past, which seemed to work fairly well.  She does have some at home, but has not. .  Contractions: Not present. Vag. Bleeding: None.  Movement: Present. Denies leaking of fluid.   The following portions of the patient's history were reviewed and updated as appropriate: allergies, current medications, past family history, past medical history, past social history, past surgical history and problem list.   Objective:   Vitals:   10/04/21 0840  BP: 108/70  Pulse: 75  Weight: 136 lb (61.7 kg)    Fetal Status: Fetal Heart Rate (bpm): 140 Fundal Height: 37 cm Movement: Present  Presentation: Vertex  General:  Alert, oriented and cooperative. Patient is in no acute distress.  Skin: Skin is warm and dry. No rash noted.   Cardiovascular: Normal heart rate noted  Respiratory: Normal respiratory effort, no problems with respiration noted  Abdomen: Soft, gravid, appropriate for gestational age.  Pain/Pressure: Present     Pelvic: Cervical exam performed in the presence of a chaperone Dilation: 2.5 Effacement (%): 50 Station: -2  Extremities: Normal range of motion.  Edema: None  Mental Status: Normal mood and affect. Normal behavior. Normal judgment and thought content.   Assessment and Plan:  Pregnancy: G2P1001 at [redacted]w[redacted]d 1. [redacted] weeks gestation of pregnancy - Culture, beta strep (group b only) - GC/Chlamydia probe amp ([place])not at [facility]  2. Supervision of other  normal pregnancy, antepartum FHT and FH normal Patient is planning induction around due date Continue Vistaril as needed.  Did offer referral to integrated behavioral health, but patient would like to just do the Vistaril - Culture, beta strep (group b only) - GC/Chlamydia probe amp ([place])not at [facility]  3. Abnormal pregnancy [place] Right-sided aortic arch.  Status post echo with [facility].  Otherwise normal cardiac structures.  Recommended echocardiogram 2 months follow-up.  Patient okay to deliver at [facility]  Term labor symptoms and general obstetric precautions including but not limited to vaginal bleeding, contractions, leaking of fluid and fetal movement were reviewed in detail with the patient. Please refer to After Visit Summary for other counseling recommendations.   No follow-ups on file.  Future Appointments  Date Time Provider Department Center  10/16/2021 11:15 AM Levie Heritage, DO [facility] None  10/23/2021 11:15 AM Levie Heritage, DO [facility] None    Levie Heritage, DO

## 2021-10-06 LAB — GC/CHLAMYDIA PROBE AMP (~~LOC~~) NOT AT ARMC
Chlamydia: NEGATIVE
Comment: NEGATIVE
Comment: NORMAL
Neisseria Gonorrhea: NEGATIVE

## 2021-10-08 LAB — CULTURE, BETA STREP (GROUP B ONLY): Strep Gp B Culture: NEGATIVE

## 2021-10-16 ENCOUNTER — Encounter (HOSPITAL_COMMUNITY): Payer: Self-pay

## 2021-10-16 ENCOUNTER — Telehealth (HOSPITAL_COMMUNITY): Payer: Self-pay | Admitting: *Deleted

## 2021-10-16 ENCOUNTER — Ambulatory Visit (INDEPENDENT_AMBULATORY_CARE_PROVIDER_SITE_OTHER): Payer: Medicaid Other | Admitting: Family Medicine

## 2021-10-16 ENCOUNTER — Encounter: Payer: Self-pay | Admitting: Family Medicine

## 2021-10-16 ENCOUNTER — Other Ambulatory Visit: Payer: Self-pay | Admitting: Advanced Practice Midwife

## 2021-10-16 VITALS — BP 105/69 | HR 84 | Wt 137.0 lb

## 2021-10-16 DIAGNOSIS — Z3480 Encounter for supervision of other normal pregnancy, unspecified trimester: Secondary | ICD-10-CM

## 2021-10-16 DIAGNOSIS — O283 Abnormal ultrasonic finding on antenatal screening of mother: Secondary | ICD-10-CM

## 2021-10-16 DIAGNOSIS — Z148 Genetic carrier of other disease: Secondary | ICD-10-CM

## 2021-10-16 DIAGNOSIS — Z3A38 38 weeks gestation of pregnancy: Secondary | ICD-10-CM

## 2021-10-16 NOTE — Telephone Encounter (Signed)
Preadmission screen  

## 2021-10-16 NOTE — Progress Notes (Signed)
   PRENATAL VISIT NOTE  Subjective:  Christina Reeves is a 26 y.o. G2P1001 at [redacted]w[redacted]d being seen today for ongoing prenatal care.  She is currently monitored for the following issues for this high-risk pregnancy and has Patient underweight; Lactose intolerance; Genetic carrier status; Supervision of other normal pregnancy, antepartum; and Abnormal pregnancy [place] on their problem list.  Patient reports occasional contractions.  Contractions: Irregular. Vag. Bleeding: None.  Movement: Present. Denies leaking of fluid.   The following portions of the patient's history were reviewed and updated as appropriate: allergies, current medications, past family history, past medical history, past social history, past surgical history and problem list.   Objective:   Vitals:   10/16/21 1110  BP: 105/69  Pulse: 84  Weight: 137 lb (62.1 kg)    Fetal Status: Fetal Heart Rate (bpm): 135   Movement: Present     General:  Alert, oriented and cooperative. Patient is in no acute distress.  Skin: Skin is warm and dry. No rash noted.   Cardiovascular: Normal heart rate noted  Respiratory: Normal respiratory effort, no problems with respiration noted  Abdomen: Soft, gravid, appropriate for gestational age.  Pain/Pressure: Present     Pelvic: Cervical exam performed in the presence of a chaperone        Extremities: Normal range of motion.  Edema: Trace  Mental Status: Normal mood and affect. Normal behavior. Normal judgment and thought content.   Assessment and Plan:  Pregnancy: G2P1001 at [redacted]w[redacted]d 1. Supervision of other normal pregnancy, antepartum FHT and FH normal Will schedule induction at 40 weeks per patient request  2. Genetic carrier status  3. Abnormal pregnancy [place] Right aortic arch, otherwise normal echo.  Recommended post natal echo around 2 months.  Term labor symptoms and general obstetric precautions including but not limited to vaginal bleeding, contractions, leaking of fluid and fetal  movement were reviewed in detail with the patient. Please refer to After Visit Summary for other counseling recommendations.   No follow-ups on file.  Future Appointments  Date Time Provider Department Center  10/23/2021 11:15 AM Levie Heritage, DO [facility] None    Levie Heritage, DO

## 2021-10-16 NOTE — Progress Notes (Signed)
ROB 38.[redacted]wks GA No unusual concersn Request SVE today

## 2021-10-17 ENCOUNTER — Telehealth (HOSPITAL_COMMUNITY): Payer: Self-pay | Admitting: *Deleted

## 2021-10-17 ENCOUNTER — Encounter (HOSPITAL_COMMUNITY): Payer: Self-pay | Admitting: *Deleted

## 2021-10-17 NOTE — Telephone Encounter (Signed)
Preadmission screen  

## 2021-10-21 ENCOUNTER — Inpatient Hospital Stay (HOSPITAL_COMMUNITY): Payer: Medicaid Other | Admitting: Anesthesiology

## 2021-10-21 ENCOUNTER — Inpatient Hospital Stay (HOSPITAL_COMMUNITY)
Admission: AD | Admit: 2021-10-21 | Discharge: 2021-10-23 | DRG: 806 | Disposition: A | Discharge status: Home / Self Care | Payer: Medicaid Other | Attending: Obstetrics & Gynecology | Admitting: Obstetrics & Gynecology

## 2021-10-21 ENCOUNTER — Inpatient Hospital Stay (HOSPITAL_COMMUNITY)
Admission: AD | Admit: 2021-10-21 | Payer: Medicaid Other | Source: Home / Self Care | Admitting: Obstetrics & Gynecology

## 2021-10-21 ENCOUNTER — Other Ambulatory Visit: Payer: Self-pay

## 2021-10-21 ENCOUNTER — Inpatient Hospital Stay (HOSPITAL_COMMUNITY): Payer: Medicaid Other

## 2021-10-21 ENCOUNTER — Encounter (HOSPITAL_COMMUNITY): Payer: Self-pay | Admitting: Family Medicine

## 2021-10-21 DIAGNOSIS — Z309 Encounter for contraceptive management, unspecified: Secondary | ICD-10-CM | POA: Diagnosis present

## 2021-10-21 DIAGNOSIS — O9081 Anemia of the puerperium: Secondary | ICD-10-CM | POA: Diagnosis not present

## 2021-10-21 DIAGNOSIS — O26893 Other specified pregnancy related conditions, third trimester: Secondary | ICD-10-CM | POA: Diagnosis present

## 2021-10-21 DIAGNOSIS — Z3A39 39 weeks gestation of pregnancy: Secondary | ICD-10-CM

## 2021-10-21 DIAGNOSIS — D62 Acute posthemorrhagic anemia: Secondary | ICD-10-CM | POA: Diagnosis not present

## 2021-10-21 DIAGNOSIS — Z3490 Encounter for supervision of normal pregnancy, unspecified, unspecified trimester: Principal | ICD-10-CM

## 2021-10-21 DIAGNOSIS — Z3480 Encounter for supervision of other normal pregnancy, unspecified trimester: Secondary | ICD-10-CM

## 2021-10-21 LAB — CBC
HCT: 26.4 % — ABNORMAL LOW (ref 36.0–46.0)
Hemoglobin: 8.8 g/dL — ABNORMAL LOW (ref 12.0–15.0)
MCH: 24.7 pg — ABNORMAL LOW (ref 26.0–34.0)
MCHC: 33.3 g/dL (ref 30.0–36.0)
MCV: 74.2 fL — ABNORMAL LOW (ref 80.0–100.0)
Platelets: 186 10*3/uL (ref 150–400)
RBC: 3.56 MIL/uL — ABNORMAL LOW (ref 3.87–5.11)
RDW: 14.2 % (ref 11.5–15.5)
WBC: 6.4 10*3/uL (ref 4.0–10.5)
nRBC: 0 % (ref 0.0–0.2)

## 2021-10-21 LAB — TYPE AND SCREEN
ABO/RH(D): O POS
Antibody Screen: NEGATIVE

## 2021-10-21 LAB — RPR: RPR Ser Ql: NONREACTIVE

## 2021-10-21 MED ORDER — ONDANSETRON HCL 4 MG/2ML IJ SOLN
4.0000 mg | Freq: Four times a day (QID) | INTRAMUSCULAR | Status: DC | PRN
  Administered 2021-10-21 (×2): 4 mg via INTRAVENOUS
  Filled 2021-10-21 (×2): qty 2

## 2021-10-21 MED ORDER — COCONUT OIL OIL: 1.0000 | TOPICAL_OIL | Status: DC | PRN

## 2021-10-21 MED ORDER — ONDANSETRON HCL 4 MG/2ML IJ SOLN: 4.0000 mg | INTRAMUSCULAR | Status: DC | PRN

## 2021-10-21 MED ORDER — WITCH HAZEL-GLYCERIN EX PADS: 1.0000 | MEDICATED_PAD | CUTANEOUS | Status: DC | PRN

## 2021-10-21 MED ORDER — BENZOCAINE-MENTHOL 20-0.5 % EX AERO
1.0000 | INHALATION_SPRAY | CUTANEOUS | Status: DC | PRN
  Administered 2021-10-22: 1 via TOPICAL
  Filled 2021-10-21: qty 56

## 2021-10-21 MED ORDER — SOD CITRATE-CITRIC ACID 500-334 MG/5ML PO SOLN: 30.0000 mL | ORAL | Status: DC | PRN

## 2021-10-21 MED ORDER — SIMETHICONE 80 MG PO CHEW: 80.0000 mg | CHEWABLE_TABLET | ORAL | Status: DC | PRN

## 2021-10-21 MED ORDER — DIBUCAINE (PERIANAL) 1 % EX OINT: 1.0000 | TOPICAL_OINTMENT | CUTANEOUS | Status: DC | PRN

## 2021-10-21 MED ORDER — LACTATED RINGERS IV SOLN: INTRAVENOUS | Status: DC

## 2021-10-21 MED ORDER — OXYTOCIN-SODIUM CHLORIDE 30-0.9 UT/500ML-% IV SOLN: 1.0000 m[IU]/min | INTRAVENOUS | Status: DC

## 2021-10-21 MED ORDER — ACETAMINOPHEN 325 MG PO TABS
650.0000 mg | ORAL_TABLET | ORAL | Status: DC | PRN
Start: 2021-10-21 — End: 2021-10-21

## 2021-10-21 MED ORDER — FENTANYL-BUPIVACAINE-NACL 0.5-0.125-0.9 MG/250ML-% EP SOLN
12.0000 mL/h | EPIDURAL | Status: DC | PRN
  Administered 2021-10-21: 12 mL/h via EPIDURAL
  Filled 2021-10-21: qty 250

## 2021-10-21 MED ORDER — TERBUTALINE SULFATE 1 MG/ML IJ SOLN
0.2500 mg | Freq: Once | INTRAMUSCULAR | Status: DC | PRN
Start: 2021-10-21 — End: 2021-10-21

## 2021-10-21 MED ORDER — DIPHENHYDRAMINE HCL 25 MG PO CAPS: 25.0000 mg | ORAL_CAPSULE | Freq: Four times a day (QID) | ORAL | Status: DC | PRN

## 2021-10-21 MED ORDER — SENNOSIDES-DOCUSATE SODIUM 8.6-50 MG PO TABS
2.0000 | ORAL_TABLET | Freq: Every day | ORAL | Status: DC
  Administered 2021-10-22 – 2021-10-23 (×2): 2 via ORAL
  Filled 2021-10-21 (×2): qty 2

## 2021-10-21 MED ORDER — ACETAMINOPHEN 325 MG PO TABS
650.0000 mg | ORAL_TABLET | ORAL | Status: DC | PRN
  Administered 2021-10-22 (×3): 650 mg via ORAL
  Filled 2021-10-21 (×3): qty 2

## 2021-10-21 MED ORDER — HYDROXYZINE HCL 25 MG PO TABS: 25.0000 mg | ORAL_TABLET | Freq: Three times a day (TID) | ORAL | Status: DC | PRN

## 2021-10-21 MED ORDER — TRANEXAMIC ACID-NACL 1000-0.7 MG/100ML-% IV SOLN
INTRAVENOUS | Status: AC
  Administered 2021-10-21: 1000 mg via INTRAVENOUS
  Filled 2021-10-21: qty 100

## 2021-10-21 MED ORDER — ONDANSETRON HCL 4 MG PO TABS: 4.0000 mg | ORAL_TABLET | ORAL | Status: DC | PRN

## 2021-10-21 MED ORDER — TRANEXAMIC ACID-NACL 1000-0.7 MG/100ML-% IV SOLN: 1000.0000 mg | Freq: Once | INTRAVENOUS | Status: AC

## 2021-10-21 MED ORDER — OXYTOCIN-SODIUM CHLORIDE 30-0.9 UT/500ML-% IV SOLN
2.5000 [IU]/h | INTRAVENOUS | Status: DC
  Administered 2021-10-21: 2.5 [IU]/h via INTRAVENOUS
  Filled 2021-10-21: qty 500

## 2021-10-21 MED ORDER — LIDOCAINE HCL (PF) 1 % IJ SOLN: 30.0000 mL | INTRAMUSCULAR | Status: DC | PRN

## 2021-10-21 MED ORDER — OXYCODONE-ACETAMINOPHEN 5-325 MG PO TABS: 1.0000 | ORAL_TABLET | ORAL | Status: DC | PRN

## 2021-10-21 MED ORDER — OXYCODONE-ACETAMINOPHEN 5-325 MG PO TABS: 2.0000 | ORAL_TABLET | ORAL | Status: DC | PRN

## 2021-10-21 MED ORDER — PHENYLEPHRINE 80 MCG/ML (10ML) SYRINGE FOR IV PUSH (FOR BLOOD PRESSURE SUPPORT)
80.0000 ug | PREFILLED_SYRINGE | INTRAVENOUS | Status: DC | PRN
  Filled 2021-10-21: qty 10

## 2021-10-21 MED ORDER — ZOLPIDEM TARTRATE 5 MG PO TABS: 5.0000 mg | ORAL_TABLET | Freq: Every evening | ORAL | Status: DC | PRN

## 2021-10-21 MED ORDER — LIDOCAINE-EPINEPHRINE (PF) 2 %-1:200000 IJ SOLN
INTRAMUSCULAR | Status: DC | PRN
  Administered 2021-10-21: 5 mL via EPIDURAL

## 2021-10-21 MED ORDER — LACTATED RINGERS IV SOLN
500.0000 mL | Freq: Once | INTRAVENOUS | Status: AC
  Administered 2021-10-21: 500 mL via INTRAVENOUS

## 2021-10-21 MED ORDER — PHENYLEPHRINE 80 MCG/ML (10ML) SYRINGE FOR IV PUSH (FOR BLOOD PRESSURE SUPPORT)
80.0000 ug | PREFILLED_SYRINGE | INTRAVENOUS | Status: DC | PRN
  Administered 2021-10-21 (×2): 80 ug via INTRAVENOUS

## 2021-10-21 MED ORDER — OXYTOCIN-SODIUM CHLORIDE 30-0.9 UT/500ML-% IV SOLN
1.0000 m[IU]/min | INTRAVENOUS | Status: DC
  Administered 2021-10-21: 2 m[IU]/min via INTRAVENOUS

## 2021-10-21 MED ORDER — TETANUS-DIPHTH-ACELL PERTUSSIS 5-2.5-18.5 LF-MCG/0.5 IM SUSY: 0.5000 mL | PREFILLED_SYRINGE | Freq: Once | INTRAMUSCULAR | Status: DC

## 2021-10-21 MED ORDER — FENTANYL CITRATE (PF) 100 MCG/2ML IJ SOLN
50.0000 ug | INTRAMUSCULAR | Status: DC | PRN
  Administered 2021-10-21: 50 ug via INTRAVENOUS
  Filled 2021-10-21: qty 2

## 2021-10-21 MED ORDER — IBUPROFEN 600 MG PO TABS
600.0000 mg | ORAL_TABLET | Freq: Four times a day (QID) | ORAL | Status: DC
  Administered 2021-10-21 – 2021-10-23 (×7): 600 mg via ORAL
  Filled 2021-10-21 (×7): qty 1

## 2021-10-21 MED ORDER — PRENATAL MULTIVITAMIN CH
1.0000 | ORAL_TABLET | Freq: Every day | ORAL | Status: DC
  Administered 2021-10-22 – 2021-10-23 (×2): 1 via ORAL
  Filled 2021-10-21 (×2): qty 1

## 2021-10-21 MED ORDER — DIPHENHYDRAMINE HCL 50 MG/ML IJ SOLN: 12.5000 mg | INTRAMUSCULAR | Status: DC | PRN

## 2021-10-21 MED ORDER — EPHEDRINE 5 MG/ML INJ: 10.0000 mg | INTRAVENOUS | Status: DC | PRN

## 2021-10-21 MED ORDER — OXYTOCIN BOLUS FROM INFUSION
333.0000 mL | Freq: Once | INTRAVENOUS | Status: AC
  Administered 2021-10-21: 333 mL via INTRAVENOUS

## 2021-10-21 MED ORDER — LACTATED RINGERS IV SOLN: 500.0000 mL | INTRAVENOUS | Status: DC | PRN

## 2021-10-21 NOTE — Anesthesia Preprocedure Evaluation (Addendum)
Anesthesia Evaluation  Patient identified by MRN, date of birth, ID band Patient awake    Reviewed: Allergy & Precautions, NPO status , Patient's Chart, lab work & pertinent test results  Airway Mallampati: II  TM Distance: >3 FB Neck ROM: Full    Dental no notable dental hx.    Pulmonary neg pulmonary ROS,    Pulmonary exam normal breath sounds clear to auscultation       Cardiovascular negative cardio ROS Normal cardiovascular exam Rhythm:Regular Rate:Normal     Neuro/Psych negative neurological ROS  negative psych ROS   GI/Hepatic negative GI ROS, Neg liver ROS,   Endo/Other  negative endocrine ROS  Renal/GU negative Renal ROS  negative genitourinary   Musculoskeletal negative musculoskeletal ROS (+)   Abdominal   Peds  Hematology  (+) Blood dyscrasia (Hgb 8.8), anemia ,   Anesthesia Other Findings   Reproductive/Obstetrics (+) Pregnancy                            Anesthesia Physical Anesthesia Plan  ASA: 2  Anesthesia Plan: Epidural   Post-op Pain Management:    Induction:   PONV Risk Score and Plan: Treatment may vary due to age or medical condition  Airway Management Planned: Natural Airway  Additional Equipment:   Intra-op Plan:   Post-operative Plan:   Informed Consent: I have reviewed the patients History and Physical, chart, labs and discussed the procedure including the risks, benefits and alternatives for the proposed anesthesia with the patient or authorized representative who has indicated his/her understanding and acceptance.       Plan Discussed with: Anesthesiologist  Anesthesia Plan Comments: (Patient identified. Risks, benefits, options discussed with patient including but not limited to bleeding, infection, nerve damage, paralysis, failed block, incomplete pain control, headache, blood pressure changes, nausea, vomiting, reactions to medication,  itching, and post partum back pain. Confirmed with bedside nurse the patient's most recent platelet count. Confirmed with the patient that they are not taking any anticoagulation, have any bleeding history or any family history of bleeding disorders. Patient expressed understanding and wishes to proceed. All questions were answered. )        Anesthesia Quick Evaluation

## 2021-10-21 NOTE — H&P (Signed)
OBSTETRIC ADMISSION HISTORY AND PHYSICAL  Christina Reeves is a 26 y.o. female G2P1001 with IUP at [redacted]w[redacted]d by LMP presenting for elective term IOL. She reports +FMs, No LOF, no VB, no blurry vision, headaches or peripheral edema, and RUQ pain.  She plans on breast and bottle feeding. She request POP for birth control. She received her prenatal care at [facility]  Dating: By LMP --->  Estimated Date of Delivery: 10/24/21   Nursing Staff Provider  Office Location [facility]  Dating  LMP c/w 10week [place]  Language  English  Anatomy US    Flu Vaccine  Declines  Genetic/Carrier Screen  NIPS:    AFP:    [place]:  TDaP Vaccine    Hgb A1C or  GTT Early  Third trimester - normal 2hr GTT  COVID Vaccine    LAB RESULTS   Rhogam  n/a Blood Type O/Positive/-- (12/14 1009)   Baby Feeding Plan Breast and bottle  Antibody Negative (12/14 1009)  Contraception POP Rubella 2.99 (12/14 1009)  Circumcision yes RPR Non Reactive (12/14 1009)   Pediatrician  [facility] HBsAg Negative (12/14 1009)   Support Person LB(FOB)  HCVAb   Prenatal Classes  HIV Non Reactive (12/14 1009)     BTL Consent  GBS  neg  VBAC Consent  Pap        DME Rx [ ]  BP cuff [ ]  Weight Scale Waterbirth  [ ]  Class [ ]  Consent [ ]  CNM visit  PHQ9 & GAD7 [  ] new OB [  ] 28 weeks  [  ] 36 weeks Induction  [ ]  Orders Entered [ ] Foley Y/N   Prenatal History/Complications: 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  Past Surgical History: Past Surgical History:  Procedure Laterality Date   NO PAST SURGERIES      Obstetrical History: OB History     Gravida  2   Para  1   Term  1   Preterm      AB      Living  1      SAB      IAB      Ectopic      Multiple  0   Live Births  1           Social History Social History   Socioeconomic History   Marital status: Married    Spouse name: Not on file   Number of  children: Not on file   Years of education: Not on file   Highest education level: Not on file  Occupational History   Not on file  Tobacco Use   Smoking status: Never   Smokeless tobacco: Never  Vaping Use   Vaping Use: Never used  Substance and Sexual Activity   Alcohol use: Not Currently    Comment: on weekends, none while preg   Drug use: Never   Sexual activity: Yes  Other Topics Concern   Not on file  Social History Narrative   Not on file   Social Determinants of Health   Financial Resource Strain: Not on file  Food Insecurity: Not on file  Transportation Needs: Not on file  Physical Activity: Not on file  Stress: Not on file  Social Connections: Not on file    Family History: Family History  Problem Relation Age of Onset   Hypertension Mother  Diabetes Brother    Cancer Paternal Grandmother        breast   Breast cancer Paternal Grandmother    Cancer Paternal Grandfather     Allergies: No Known Allergies  Medications Prior to Admission  Medication Sig Dispense Refill Last Dose   hydrOXYzine (ATARAX) 25 MG tablet Take 0.5-1 tablets (12.5-25 mg total) by mouth 3 (three) times daily as needed for anxiety. 15 tablet 6    Prenatal Vit-Fe Fumarate-FA (PREPLUS) 27-1 MG TABS Take 1 tablet by mouth daily. 90 tablet 3    Review of Systems   All systems reviewed and negative except as stated in HPI  Last menstrual period 01/10/2021, currently breastfeeding. General appearance: alert, cooperative, and no distress Lungs: clear to auscultation bilaterally Heart: regular rate and rhythm Abdomen: soft, non-tender; bowel sounds normal Pelvic: n/a Extremities: Homans sign is negative, no sign of DVT DTR's +2 Presentation: cephalic Fetal monitoringBaseline: 120 bpm, Variability: Good {> 6 bpm), Accelerations: Reactive, and Decelerations: Absent Uterine activityNone     Prenatal labs: ABO, Rh: O/Positive/-- (12/14 1009) Antibody: Negative (12/14  1009) Rubella: 2.99 (12/14 1009) RPR: Non Reactive (04/19 0840)  HBsAg: Negative (12/14 1009)  HIV: Non Reactive (04/19 0840)  GBS: Negative/-- (06/16 0909)   Prenatal Transfer Tool  Maternal Diabetes: No Genetic Screening: Normal Maternal Ultrasounds/Referrals: Other:suspected right aortic arch Fetal Ultrasounds or other Referrals:  Referred to Materal Fetal Medicine  Maternal Substance Abuse:  No Significant Maternal Medications:  None Significant Maternal Lab Results: Group B Strep negative  No results found for this or any previous visit (from the past 24 hour(s)).  Patient Active Problem List   Diagnosis Date Noted   Encounter for induction of labor 10/21/2021   Abnormal pregnancy [place] 08/07/2021   Supervision of other normal pregnancy, antepartum 04/03/2021   Genetic carrier status 04/13/2019   Patient underweight 03/15/2019   Lactose intolerance 03/15/2019    Assessment/Plan:  Christina Reeves is a 26 y.o. G2P1001 at [redacted]w[redacted]d here for elective term IOL  #Labor: favorable cervix, will start pitocin. Patient desires to eat breakfast before starting IOL #Pain: Per patient request #FWB: Cat 1 #ID:  GBS neg #MOF: breast and bottle #MOC: POP #Circ:  yes  Rolm Bookbinder, CNM  10/21/2021, 6:20 AM

## 2021-10-21 NOTE — Progress Notes (Addendum)
Christina Reeves is a 26 y.o. G2P1001 at [redacted]w[redacted]d by ultrasound admitted for induction of labor due to Elective at term.  Subjective: Patient is doing well bouncing on birthing ball. Plans for epidural for pain. Expecting baby boy.   Objective: BP 103/64   Pulse 92   Resp 16   Ht 5\' 5"  (1.651 m)   Wt 64 kg   LMP 01/10/2021   BMI 23.46 kg/m  No intake/output data recorded. No intake/output data recorded.  FHT:  FHR: 135 bpm, variability: moderate,  accelerations:  Present,  decelerations:  Absent UC:   irregular, every 5-7 minutes SVE:   Dilation: 3.5 Effacement (%): 50 Station: -3, -2 Exam by:: [email or web address], RN  Labs: Lab Results  Component Value Date   WBC 6.4 10/21/2021   HGB 8.8 (L) 10/21/2021   HCT 26.4 (L) 10/21/2021   MCV 74.2 (L) 10/21/2021   PLT 186 10/21/2021    Assessment / Plan: Induction of labor due to term with favorable cervix,  progressing well on pitocin  Labor:  on Pitocin, will continue to increase then assess for AROM.  Fetal Wellbeing:  Category I Continue to monitor for signs of distress.  Pain Control:  Epidural Patient may have an epidural upon request.  I/D:   GBS Negative  Anticipated MOD:  NSVD. Starting Hbg 8.8, plan for TXA at delivery.   12/22/2021, CNM 10/21/2021, 9:42 AM

## 2021-10-21 NOTE — Progress Notes (Signed)
Christina Reeves is a 26 y.o. G2P1001 at [redacted]w[redacted]d by ultrasound admitted for induction of labor due to Elective at term.  Subjective: Patient is doing well. Has been up walking and bouncing on peanut ball. Currently rates her pain 6/10 and requesting IV pain medication.   Objective: BP 115/71   Pulse 86   Temp 97.8 F (36.6 C) (Axillary)   Resp 16   Ht 5\' 5"  (1.651 m)   Wt 64 kg   LMP 01/10/2021   BMI 23.46 kg/m  No intake/output data recorded. No intake/output data recorded.  FHT:  FHR: 130 bpm, variability: moderate,  accelerations:  Present,  decelerations:  Absent UC:   regular, every 2-4 minutes SVE:   Dilation: 5 Effacement (%): 80 Station: -1, 0 Exam by:: [email or web address], CNM  Labs: Lab Results  Component Value Date   WBC 6.4 10/21/2021   HGB 8.8 (L) 10/21/2021   HCT 26.4 (L) 10/21/2021   MCV 74.2 (L) 10/21/2021   PLT 186 10/21/2021   Discussed the option of AROM with patient and FOB at bedside. Risk and benefits discussed and questions answered at bedside. Patient is agreeable with plan.   Procedure: Verbal consent obtained for AROM. Cervix assessed 5/80/-1. Fetal head well applied to cervix. FHT: Cat I.  Patient a good candidate for AROM. CNM AROM with [place], without difficulty. Large amount of Clear fluid noted. FHT: remained Cat I. Final exam:  Dilation: 5 Effacement (%): 80 Cervical Position: Posterior Station: -1, 0 Presentation: Vertex Exam by:: [email or web address], CNM   Assessment / Plan: Induction of labor due to elective,  progressing well on pitocin. AROM.   Labor: AROM'd @ 1325. Large amount Clear fluid. Continue to titrate pit appropriately, as needed.  Fetal Wellbeing:  Category I continue to monitor for signs of distress.  Pain Control:  Epidural and IV pain meds. Patient may have epidural upon request.  I/D:   GBS Neg.  Anticipated MOD:  NSVD  Suzie Portela, CNM 10/21/2021, 1:29 PM

## 2021-10-21 NOTE — Anesthesia Procedure Notes (Signed)
Epidural Patient location during procedure: OB Start time: 10/21/2021 2:15 PM End time: 10/21/2021 2:25 PM  Staffing Anesthesiologist: Elmer Picker, MD Performed: anesthesiologist   Preanesthetic Checklist Completed: patient identified, IV checked, risks and benefits discussed, monitors and equipment checked, pre-op evaluation and timeout performed  Epidural Patient position: sitting Prep: DuraPrep and site prepped and draped Patient monitoring: continuous pulse ox, blood pressure, heart rate and cardiac monitor Approach: midline Location: L3-L4 Injection technique: LOR air  Needle:  Needle type: Tuohy  Needle gauge: 17 G Needle length: 9 cm Needle insertion depth: 5 cm Catheter type: closed end flexible Catheter size: 19 Gauge Catheter at skin depth: 10 cm Test dose: negative  Assessment Sensory level: T8 Events: blood not aspirated, injection not painful, no injection resistance, no paresthesia and negative IV test  Additional Notes Patient identified. Risks/Benefits/Options discussed with patient including but not limited to bleeding, infection, nerve damage, paralysis, failed block, incomplete pain control, headache, blood pressure changes, nausea, vomiting, reactions to medication both or allergic, itching and postpartum back pain. Confirmed with bedside nurse the patient's most recent platelet count. Confirmed with patient that they are not currently taking any anticoagulation, have any bleeding history or any family history of bleeding disorders. Patient expressed understanding and wished to proceed. All questions were answered. Sterile technique was used throughout the entire procedure. Please see nursing notes for vital signs. Test dose was given through epidural catheter and negative prior to continuing to dose epidural or start infusion. Warning signs of high block given to the patient including shortness of breath, tingling/numbness in hands, complete motor block, or  any concerning symptoms with instructions to call for help. Patient was given instructions on fall risk and not to get out of bed. All questions and concerns addressed with instructions to call with any issues or inadequate analgesia.  Reason for block:procedure for pain

## 2021-10-22 LAB — CBC
HCT: 25.5 % — ABNORMAL LOW (ref 36.0–46.0)
Hemoglobin: 8.5 g/dL — ABNORMAL LOW (ref 12.0–15.0)
MCH: 24.6 pg — ABNORMAL LOW (ref 26.0–34.0)
MCHC: 33.3 g/dL (ref 30.0–36.0)
MCV: 73.9 fL — ABNORMAL LOW (ref 80.0–100.0)
Platelets: 178 10*3/uL (ref 150–400)
RBC: 3.45 MIL/uL — ABNORMAL LOW (ref 3.87–5.11)
RDW: 14.3 % (ref 11.5–15.5)
WBC: 10.8 10*3/uL — ABNORMAL HIGH (ref 4.0–10.5)
nRBC: 0 % (ref 0.0–0.2)

## 2021-10-22 MED ORDER — OXYCODONE HCL 5 MG PO TABS
5.0000 mg | ORAL_TABLET | ORAL | Status: DC | PRN
  Administered 2021-10-22: 5 mg via ORAL
  Filled 2021-10-22: qty 1

## 2021-10-22 NOTE — Progress Notes (Signed)
Post Partum Day 1 Subjective: no complaints, up ad lib, voiding, and tolerating PO  Objective: Blood pressure 104/75, pulse 75, temperature 98.9 F (37.2 C), temperature source Oral, resp. rate 18, height 5\' 5"  (1.651 m), weight 64 kg, last menstrual period 01/10/2021, SpO2 100 %, unknown if currently breastfeeding.  Physical Exam:  General: alert, cooperative, and no distress Lochia: appropriate Uterine Fundus: firm Incision: n/a DVT Evaluation: No evidence of DVT seen on physical exam. Negative Homan's sign. No cords or calf tenderness.  Recent Labs    10/21/21 0655 10/22/21 0507  HGB 8.8* 8.5*  HCT 26.4* 25.5*    Assessment/Plan: Plan for discharge tomorrow   LOS: 1 day   12/23/21, DO 10/22/2021, 10:02 AM

## 2021-10-22 NOTE — Social Work (Signed)
CSW received consult for hx of Anxiety. CSW met with MOB to offer support and complete assessment.    CSW met with MOB at bedside and introduced CSW role. CSW observed MOB in bed and FOB at bedside. The infant was out of the room for circumcision procedure. MOB presented calm and welcomed CSW visit. CSW inquired how MOB has felt since giving birth. MOB reported feeling good and shared that the L&D went well. MOB reported during the pregnancy that she had "lots of anxiety." MOB acknowledged that she has a history of anxiety. MOB reported that her anxiety "is new with the pregnancies." MOB reported having anxiety with her first child as she "was always on edge." MOB reported that she treats her symptoms with Vistaril as needed which she feels is helpful and will continue to the medication postpartum. MOB identified her spouse as her primary support.   CSW provided education regarding the baby blues period vs. perinatal mood disorders, discussed treatment and gave resources for mental health follow up if concerns arise.  CSW recommended self-evaluation during the postpartum time period using the New Mom Checklist from Postpartum Progress and encouraged MOB to contact a medical professional if symptoms are noted at any time.    MOB reported she has essential items for the infant including a bassinet where the infant will sleep. CSW provided review of Sudden Infant Death Syndrome (SIDS) precautions. CSW assessed MOB for additional needs. MOB reported no further need.   CSW identifies no further need for intervention and no barriers to discharge at this time.   Kathrin Greathouse, MSW, LCSW [facility] [place] Worker  [phone] 04-Oct-2021  2:32 PM

## 2021-10-22 NOTE — Lactation Note (Signed)
This note was copied from a baby's chart. Lactation Consultation Note  Patient Name: Christina Reeves Date: 10/22/2021 Reason for consult: Follow-up assessment;Term Age:27 hours  P2 mother whose infant is now 62 hours old.  This is a term baby at 39+4 weeks.  Mother's current feeding preference is breast.  Family had visitors when I arrived.  Mother had no questions/concerns related to breast feeding.  She reported that baby has been latching/feeding well.  Last LATCH score was a 9; voiding/stooling.  Encouraged to call her RN/LC for latch assistance if needed.   Maternal Data    Feeding Mother's Current Feeding Choice: Breast Milk  LATCH Score                    Lactation Tools Discussed/Used    Interventions    Discharge    Consult Status Consult Status: Follow-up Date: 10/23/21 Follow-up type: In-patient    Addis Tuohy R Taquita Demby 10/22/2021, 1:11 PM

## 2021-10-22 NOTE — Lactation Note (Signed)
This note was copied from a baby's chart. Lactation Consultation Note  Patient Name: Christina Reeves [id number] Date: 10/22/2021 Reason for consult: Initial assessment;Term Age:27 hours Mom had latched infant prior to [facility] entering the room on her right breast,LC assisted mom with latching infant on her left breast using the cradle hold. Infant latched with depth and was still breastfeeding after 15 minutes when LC left the room. Mom had requested DEBP LC placed mom parts in room, LC suggested mom wait  on using DEBP, due to infant having good latch with depth,  mom will  continue to work on latching infant at the breast. Mom will continue to breastfeed infant according to hunger cues, on demand, 8 to 12+ times within 24 hours, skin to skin. Mom knows to call RN/LC if she need further latch assistance.  Mom made aware of O/P services, breastfeeding support groups, community resources, and our phone # for post-discharge questions.   Maternal Data Has patient been taught Hand Expression?: Yes Does the patient have breastfeeding experience prior to this delivery?: Yes How long did the patient breastfeed?: Per mom, she BF her 1st child for 5 months who is currently 51 years old.  Feeding Mother's Current Feeding Choice: Breast Milk  LATCH Score Latch: Grasps breast easily, tongue down, lips flanged, rhythmical sucking.  Audible Swallowing: Spontaneous and intermittent  Type of Nipple: Everted at rest and after stimulation  Comfort (Breast/Nipple): Soft / non-tender  Hold (Positioning): Assistance needed to correctly position infant at breast and maintain latch.  LATCH Score: 9   Lactation Tools Discussed/Used    Interventions Interventions: Breast feeding basics reviewed;Assisted with latch;Skin to skin;Breast compression;Adjust position;Support pillows;Position options;Expressed milk;Education;LC Services brochure  Discharge    Consult Status      Danelle Earthly 10/22/2021, 12:18  AM

## 2021-10-22 NOTE — Anesthesia Postprocedure Evaluation (Signed)
Anesthesia Post Note  Patient: Christina Reeves  Procedure(s) Performed: AN AD HOC LABOR EPIDURAL     Patient location during evaluation: Mother Baby Anesthesia Type: Epidural Level of consciousness: awake and alert and oriented Pain management: satisfactory to patient Vital Signs Assessment: post-procedure vital signs reviewed and stable Respiratory status: respiratory function stable Cardiovascular status: stable Postop Assessment: no headache, no backache, epidural receding, patient able to bend at knees, no signs of nausea or vomiting, adequate PO intake and able to ambulate Anesthetic complications: no   No notable events documented.  Last Vitals:  Vitals:   10/22/21 0500 10/22/21 0747  BP: 103/75 104/75  Pulse: 81 75  Resp: 18 18  Temp:  37.2 C  SpO2: 100%     Last Pain:  Vitals:   10/22/21 0816  TempSrc:   PainSc: 3    Pain Goal:                   Javarian Jakubiak

## 2021-10-22 NOTE — Lactation Note (Signed)
This note was copied from a baby's chart. Lactation Consultation Note  Patient Name: Christina Reeves XOVAN'V Date: 10/22/2021 Reason for consult: Follow-up assessment;Mother's request;Term Age:27 hours   LC Follow Up Consult:  Mother requested a latch observation  Mother had baby latched when I arrived; good positioning.  Taught her how to obtain a deeper latch and mother easily demonstrated.  Observed baby feeding for 5 minutes with a wide gape and flanged lips; occasional swallow noted.  Praised mother for her efforts.  Father at bedside and will continue to assist mother throughout the night.  Family hoping to be discharged tomorrow.   Maternal Data    Feeding Mother's Current Feeding Choice: Breast Milk  LATCH Score Latch: Grasps breast easily, tongue down, lips flanged, rhythmical sucking.  Audible Swallowing: A few with stimulation  Type of Nipple:  (Did not observe due to baby being latched when I arrived)  Comfort (Breast/Nipple): Soft / non-tender  Hold (Positioning): No assistance needed to correctly position infant at breast.      Lactation Tools Discussed/Used    Interventions Interventions: Breast feeding basics reviewed;Assisted with latch;Skin to skin;Education  Discharge    Consult Status Consult Status: Follow-up Date: 10/23/21 Follow-up type: In-patient    Alem Fahl R Jenniah Bhavsar 10/22/2021, 2:24 PM

## 2021-10-23 ENCOUNTER — Encounter: Payer: Medicaid Other | Admitting: Family Medicine

## 2021-10-23 DIAGNOSIS — D62 Acute posthemorrhagic anemia: Secondary | ICD-10-CM | POA: Diagnosis not present

## 2021-10-23 MED ORDER — ACETAMINOPHEN 500 MG PO TABS: 1000.0000 mg | ORAL_TABLET | Freq: Four times a day (QID) | ORAL | 0 refills | Status: DC | PRN

## 2021-10-23 MED ORDER — FERROUS GLUCONATE 324 (38 FE) MG PO TABS: 324.0000 mg | ORAL_TABLET | Freq: Every day | ORAL | 3 refills | Status: DC

## 2021-10-23 MED ORDER — IBUPROFEN 600 MG PO TABS: 600.0000 mg | ORAL_TABLET | Freq: Four times a day (QID) | ORAL | 2 refills | Status: DC | PRN

## 2021-10-23 NOTE — Lactation Note (Signed)
This note was copied from a baby's chart. Lactation Consultation Note  Patient Name: Christina Reeves UDTHY'H Date: 10/23/2021 Reason for consult: Follow-up assessment;Term Age:27 hours   P2 mother whose infant is now 75 hours old.  This is a term baby at 39+4 weeks.  Mother's current feeding preference is breast.  Baby has an 8% weight loss this morning.  Baby "[name]" was asleep in mother's arms when I arrived.  Discussed weight loss with parents and encouraged lots of frequent feedings and hand expression after discharge.  Mother will feed back any expressed milk she obtains to baby.  I did observe a breast feeding session yesterday and mother/baby are working well together.  After discussion about obtaining a deeper latch, mother reported that "[name]" has been latching deeply and feeding well.  Last LATCH score was a 9; multiple voids/stools.  Parents are awaiting a discharge; discussed the discharge procedure today.  Family has our OP phone number for any questions/concerns.    Maternal Data    Feeding Mother's Current Feeding Choice: Breast Milk  LATCH Score                    Lactation Tools Discussed/Used    Interventions    Discharge Discharge Education: Engorgement and breast care Pump: Personal  Consult Status Consult Status: Complete Date: 10/23/21 Follow-up type: Call as needed    Aquinnah Devin R Raeli Wiens 10/23/2021, 8:23 AM

## 2021-10-23 NOTE — Discharge Summary (Addendum)
Postpartum Discharge Summary     Patient Name: Christina Reeves DOB: 12-16-1995 MRN: [id number]  Date of admission: 10/21/2021 Delivery date:10/21/2021  Delivering provider: Carlynn Herald  Date of discharge: 10/23/2021  Admitting diagnosis: Encounter for induction of labor [Z34.90] Intrauterine pregnancy: [redacted]w[redacted]d     Secondary diagnosis:  Principal Problem:   Postpartum care following vaginal delivery Active Problems:   Postpartum anemia due to acute blood loss anemia  Additional problems: None   Discharge diagnosis: Term Pregnancy Delivered                                              Post partum procedures: None Augmentation: AROM and Pitocin Complications: None  Hospital course: Induction of Labor With Vaginal Delivery   26 y.o. yo [id number] at [redacted]w[redacted]d was admitted to the hospital 10/21/2021 for induction of labor.  Indication for induction: Elective.  Patient had an uncomplicated labor course as follows: Membrane Rupture Time/Date: 1:25 PM ,10/21/2021   Delivery Method:Vaginal, Spontaneous  Episiotomy: None  Lacerations:  2nd degree;Periurethral  Details of delivery can be found in separate delivery note.  Patient had a routine postpartum course. Patient is discharged home 10/23/21.  Newborn Data: Birth date:10/21/2021  Birth time:6:12 PM  Gender:Female  Living status:Living  Apgars:8 ,9  Weight:3520 g   Physical exam  Vitals:   10/22/21 1219 10/22/21 1619 10/22/21 2111 10/23/21 0500  BP: 105/67 102/63 109/70 110/75  Pulse: 76 81 68 73  Resp: 18 18 18    Temp: 98.4 F (36.9 C) 98.6 F (37 C) 98.2 F (36.8 C)   TempSrc: Oral Oral Axillary   SpO2: 100% 100% 100%   Weight:      Height:       General: alert, cooperative, and no distress Lochia: appropriate Uterine Fundus: firm Incision: N/A DVT Evaluation: No evidence of DVT seen on physical exam. Negative Homan's sign. No cords or calf tenderness. No significant calf/ankle edema. Labs: Lab Results  Component  Value Date   WBC 10.8 (H) 10/22/2021   HGB 8.5 (L) 10/22/2021   HCT 25.5 (L) 10/22/2021   MCV 73.9 (L) 10/22/2021   PLT 178 10/22/2021      Latest Ref Rng & Units 04/03/2021   10:09 AM  CMP  Glucose 70 - 99 mg/dL 75   BUN 6 - 20 mg/dL 14   Creatinine 04/05/2021 - 1.00 mg/dL 9.51   Sodium 8.84 - 166 mmol/L 138   Potassium 3.5 - 5.2 mmol/L 4.1   Chloride 96 - 106 mmol/L 102   CO2 20 - 29 mmol/L 22   Calcium 8.7 - 10.2 mg/dL 9.5   Total Protein 6.0 - 8.5 g/dL 6.2   Total Bilirubin 0.0 - 1.2 mg/dL 0.2   Alkaline Phos 44 - 121 IU/L 39   AST 0 - 40 IU/L 18   ALT 0 - 32 IU/L 13    Edinburgh Score:    08/07/2021   11:11 AM  Edinburgh Postnatal Depression Scale Screening Tool  I have been able to laugh and see the funny side of things. 1  I have looked forward with enjoyment to things. 0  I have blamed myself unnecessarily when things went wrong. 1  I have been anxious or worried for no good reason. 1  I have felt scared or panicky for no good reason. 0  Things have been getting  on top of me. 0  I have been so unhappy that I have had difficulty sleeping. 0  I have felt sad or miserable. 0  I have been so unhappy that I have been crying. 0  The thought of harming myself has occurred to me. 0  Edinburgh Postnatal Depression Scale Total 3      After visit meds:  Allergies as of 10/23/2021   No Known Allergies      Medication List     STOP taking these medications    omeprazole 20 MG capsule Commonly known as: PRILOSEC       TAKE these medications    acetaminophen 500 MG tablet Commonly known as: TYLENOL Take 2 tablets (1,000 mg total) by mouth every 6 (six) hours as needed for mild pain (for pain scale < 4).   ferrous gluconate 324 MG tablet Commonly known as: FERGON Take 1 tablet (324 mg total) by mouth daily with breakfast.   hydrOXYzine 25 MG tablet Commonly known as: ATARAX Take 0.5-1 tablets (12.5-25 mg total) by mouth 3 (three) times daily as needed for  anxiety.   ibuprofen 600 MG tablet Commonly known as: ADVIL Take 1 tablet (600 mg total) by mouth every 6 (six) hours as needed for moderate pain, fever or cramping.   PrePLUS 27-1 MG Tabs Take 1 tablet by mouth daily.         Discharge home in stable condition Infant Feeding: Bottle Infant Disposition:home with mother Discharge instruction: per After Visit Summary and Postpartum booklet. Activity: Advance as tolerated. Pelvic rest for 6 weeks.  Diet: routine diet Anticipated Birth Control: POPs Postpartum Appointment:4 weeks Future Appointments  Date Time Provider Department Center  11/28/2021 10:15 AM Levie Heritage, DO [facility] None       10/23/2021 Jaynie Collins, MD

## 2021-10-24 ENCOUNTER — Inpatient Hospital Stay (HOSPITAL_COMMUNITY): Payer: Medicaid Other

## 2021-10-30 ENCOUNTER — Telehealth (HOSPITAL_COMMUNITY): Payer: Self-pay | Admitting: *Deleted

## 2021-10-30 NOTE — Telephone Encounter (Signed)
Left phone voicemail message.  Duffy Rhody, RN 10-30-2021 at 9:51am

## 2021-11-13 ENCOUNTER — Encounter: Payer: Self-pay | Admitting: Family Medicine

## 2021-11-14 ENCOUNTER — Encounter: Payer: Self-pay | Admitting: Family Medicine

## 2021-11-15 ENCOUNTER — Encounter: Payer: Self-pay | Admitting: Family Medicine

## 2021-11-20 ENCOUNTER — Encounter: Payer: Self-pay | Admitting: General Practice

## 2021-11-28 ENCOUNTER — Ambulatory Visit (INDEPENDENT_AMBULATORY_CARE_PROVIDER_SITE_OTHER): Payer: Medicaid Other | Admitting: Family Medicine

## 2021-11-28 ENCOUNTER — Encounter: Payer: Self-pay | Admitting: Family Medicine

## 2021-11-28 DIAGNOSIS — F410 Panic disorder [episodic paroxysmal anxiety] without agoraphobia: Secondary | ICD-10-CM

## 2021-11-28 MED ORDER — NORETHINDRONE 0.35 MG PO TABS: 1.0000 | ORAL_TABLET | Freq: Every day | ORAL | 3 refills | Status: DC

## 2021-11-28 NOTE — Progress Notes (Signed)
Post Partum Visit Note  Christina Reeves is a 26 y.o. [id number] female who presents for a postpartum visit. She is 5 weeks postpartum following a normal spontaneous vaginal delivery.  I have fully reviewed the prenatal and intrapartum course. The delivery was at [redacted]w[redacted]d gestational weeks.  Anesthesia: epidural. Postpartum course has been unremarkable. Baby is doing well. Baby is feeding by breast. Pt requests rx to increase milk supply. Bleeding no bleeding. Bowel function is normal. Bladder function is normal. Patient is not sexually active. Contraception method is none. Pt requests to start progesterone only breastfeeding. Postpartum depression screening: negative.  Did not start SSRI. Started back at work recently, which helped.  The pregnancy intention screening data noted above was reviewed. Potential methods of contraception were discussed. The patient elected to proceed with No data recorded.   Edinburgh Postnatal Depression Scale - 11/28/21 1030       Edinburgh Postnatal Depression Scale:  In the Past 7 Days   I have been able to laugh and see the funny side of things. 0    I have looked forward with enjoyment to things. 0    I have blamed myself unnecessarily when things went wrong. 0    I have been anxious or worried for no good reason. 0    I have felt scared or panicky for no good reason. 0    Things have been getting on top of me. 0    I have been so unhappy that I have had difficulty sleeping. 0    I have felt sad or miserable. 0    I have been so unhappy that I have been crying. 0    The thought of harming myself has occurred to me. 0    Edinburgh Postnatal Depression Scale Total 0             Health Maintenance Due  Topic Date Due   COVID-19 Vaccine (1) Never done   INFLUENZA VACCINE  11/19/2021    The following portions of the patient's history were reviewed and updated as appropriate: allergies, current medications, past family history, past medical history, past  social history, past surgical history, and problem list.  Review of Systems Pertinent items are noted in HPI.  Objective:  BP 105/70   Pulse 65   Wt 136 lb (61.7 kg)   BMI 22.63 kg/m    General:  alert, cooperative, and no distress   Breasts:  not indicated  Lungs: clear to auscultation bilaterally  Heart:  regular rate and rhythm, S1, S2 normal, no murmur, click, rub or gallop  Abdomen: soft, non-tender; bowel sounds normal; no masses,  no organomegaly   Wound N/a  GU exam:  not indicated       Assessment:   1. Postpartum care following vaginal delivery   2. Panic attack      Plan:   Essential components of care per ACOG recommendations:  1.  Mood and well being: Patient with negative depression screening today. Reviewed local resources for support.  - Patient tobacco use? No.   - hx of drug use? No.    2. Infant care and feeding:  -Patient currently breastmilk feeding? Yes. Reviewed importance of draining breast regularly to support lactation. Recommended lactation supplements. If still having problems with milk supply, can try reglan -Social determinants of health (SDOH) reviewed in EPIC. No concerns  3. Sexuality, contraception and birth spacing - Patient does not want a pregnancy in the next year.   -  Reviewed reproductive life planning. Reviewed contraceptive methods based on pt preferences and effectiveness.  Patient desired Oral Contraceptive today.   - Discussed birth spacing of 18 months  4. Sleep and fatigue -Encouraged family/partner/community support of 4 hrs of uninterrupted sleep to help with mood and fatigue  5. Physical Recovery  - Discussed patients delivery and complications. She describes her labor as good. - Patient had a Vaginal, no problems at delivery. Patient had a 2nd degree laceration. Perineal healing reviewed. Patient expressed understanding - Patient has urinary incontinence? No. - Patient is safe to resume physical and sexual  activity  6.  Health Maintenance - HM due items addressed Yes - Last pap smear  Diagnosis  Date Value Ref Range Status  04/03/2021   Final   - Negative for intraepithelial lesion or malignancy (NILM)   Pap smear not done at today's visit.  -Breast Cancer screening indicated? No.   7. Chronic Disease/Pregnancy Condition follow up: None  - PCP follow up  Levie Heritage, DO Center for [facility], [facility]

## 2021-12-04 ENCOUNTER — Encounter: Payer: Self-pay | Admitting: Family Medicine

## 2021-12-05 ENCOUNTER — Other Ambulatory Visit: Payer: Self-pay

## 2021-12-05 NOTE — Progress Notes (Signed)
Patient called stating she sent Dr. Adrian Blackwater a message about bleeding. She is six weeks postpartum and having heavier bleeding than she expected. Patient started the micronor last week and starting bleeding a few days ago.  Patient asked how quickly she is filling a maxi pad. Patient states this morning she has filled a panty liner at work and has had to go home to change. Patient made aware that generally after delivery the first period can be heavier than normal periods.  Patient states she was told by Dr. Adrian Blackwater she might not have a period at all starting the micronor. Patient made aware I will route information to provider and ask him to reply to her my chart message. Victorino Dike [facility]

## 2021-12-31 ENCOUNTER — Encounter: Payer: Self-pay | Admitting: Family Medicine

## 2022-01-01 ENCOUNTER — Encounter: Payer: Self-pay | Admitting: Family Medicine

## 2022-01-01 MED ORDER — NORGESTIMATE-ETH ESTRADIOL 0.25-35 MG-MCG PO TABS: 1.0000 | ORAL_TABLET | Freq: Every day | ORAL | 3 refills | Status: DC

## 2022-01-16 ENCOUNTER — Encounter: Payer: Self-pay | Admitting: Family Medicine

## 2022-01-16 ENCOUNTER — Other Ambulatory Visit: Payer: Self-pay

## 2022-01-16 MED ORDER — FLUCONAZOLE 150 MG PO TABS: 150.0000 mg | ORAL_TABLET | Freq: Once | ORAL | 1 refills | Status: AC

## 2022-04-18 ENCOUNTER — Ambulatory Visit: Payer: Medicaid Other | Admitting: Student

## 2022-04-18 ENCOUNTER — Encounter: Payer: Self-pay | Admitting: Student

## 2022-04-18 VITALS — BP 112/65 | HR 67 | Temp 98.3°F | Ht 65.0 in | Wt 121.8 lb

## 2022-04-18 DIAGNOSIS — L650 Telogen effluvium: Secondary | ICD-10-CM | POA: Insufficient documentation

## 2022-04-18 DIAGNOSIS — Z7689 Persons encountering health services in other specified circumstances: Secondary | ICD-10-CM | POA: Diagnosis not present

## 2022-04-18 NOTE — Assessment & Plan Note (Signed)
Patient notes two months of hair loss, located over temples/fore head and crown of scalp. Patient denies any flaking, itching, or changes to scalp. Patient denies any change in hair care products, but notes having started birth control a month or so before the hair loss. Patient recently had a baby, who is now about 1 months old. 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 Patient with diffuse thinning and loss of hair. Also considered ring worm, but less likely w/o well circumscribed lesion with crusting/itching. Will check a TSH for concern of thyroid dysfunction. Reassured patient that this should resolve in the next 6 months or so.  -TSH -Reassurance provided

## 2022-04-18 NOTE — Patient Instructions (Addendum)
It was great to see you! Thank you for allowing me to participate in your care!  It looks like your hair loss is coming from hormonal changes related to having given birth. This is called Telogen Effluvium, and is not coming from your birth control. Allow for 6 months for hair to return/slow shedding. To be sure this is not coming from another process, we will test your thyroid.   Our plans for today:  - Telogen Effluvium  There is no treatment for this, we just wait for the hair loss to stop and hair growth to return. TSH lab for thyroid function   Take care and seek immediate care sooner if you develop any concerns.   Dr. Bess Kinds, MD [facility]

## 2022-04-18 NOTE — Progress Notes (Signed)
  SUBJECTIVE:   CHIEF COMPLAINT / HPI:   Establishing Care  PMHx: Asthma as a child Depression  Anxiety - Atarax prn 2 vaginal births  PSHx: Wisdom tooth extraction (6 teeth) - 2013   FamHx: Mom - HTN Younger brother - DM1 Great uncle- mom side- CVA [place] Grandpa -dad side - Lung cancer, Grandpa -dad side- COPD  Meds: Norgestimate-ethinyl estradiol Omeprazole Claritin    Allergies: Seasonal   Social  Home: Husband, 2 children (6 month, 27 yo)   Work: not  Sex: On birth control, not using protection, STI testing this past year  Tobacco: none  EtOH: On occasion, a couple of beers  Drugs: None  Health Mait: Pap smear: normal 04/03/21, repeat 04/03/24   Hair loss Has noticed hair loss for last couple of months. Has been coming out in the shower/when she does her hair. Noting thinning and bald spots. Started new birth control in September/October, w/ hair loss starting October/November. Notes some hair loss with first birth. No change in hair products. Denies any scalp changes, itching, or flaking.    PERTINENT  PMH / PSH:   OBJECTIVE:  BP 112/65   Pulse 67   Temp 98.3 F (36.8 C)   Ht 5\' 5"  (1.651 m)   Wt 121 lb 12.8 oz (55.2 kg)   LMP 03/20/2022 (Approximate)   SpO2 99%   BMI 20.27 kg/m  Physical Exam Skin:    Comments: Thinning of hair overlying forehead/temples, with patch of baldness located at crown of scalp         ASSESSMENT/PLAN:  Telogen effluvium Assessment & Plan: Patient notes two months of hair loss, located over temples/fore head and crown of scalp. Patient denies any flaking, itching, or changes to scalp. Patient denies any change in hair care products, but notes having started birth control a month or so before the hair loss. Patient recently had a baby, who is now about 24 months old. 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 Patient with diffuse thinning and loss of hair. Also considered ring worm, but less likely w/o well circumscribed lesion with crusting/itching. Will check a TSH for concern of thyroid dysfunction. Reassured patient that this should resolve in the next 6 months or so.  -TSH -Reassurance provided  Orders: -     TSH Rfx on Abnormal to Free T4   No follow-ups on file. 4 months, MD 04/18/2022, 2:31 PM PGY-2, [facility]

## 2022-04-19 LAB — TSH RFX ON ABNORMAL TO FREE T4: TSH: 1.96 u[IU]/mL (ref 0.450–4.500)

## 2022-05-26 ENCOUNTER — Ambulatory Visit: Payer: Medicaid Other | Admitting: Obstetrics and Gynecology

## 2022-05-26 ENCOUNTER — Encounter: Payer: Self-pay | Admitting: Obstetrics and Gynecology

## 2022-05-26 VITALS — BP 129/78 | HR 65 | Wt 117.0 lb

## 2022-05-26 DIAGNOSIS — Z711 Person with feared health complaint in whom no diagnosis is made: Secondary | ICD-10-CM

## 2022-05-26 NOTE — Progress Notes (Signed)
   GYNECOLOGY OFFICE VISIT NOTE  History:   Christina Reeves is a 27 y.o. [phone] here today for concern of retained tampon. She knows she put in a tampon on Saturday but unsure if she ever removed it. She has felt a little off the last couple days but no specific localized symptoms I.e. discharge or odor.     History reviewed. No pertinent past medical history.  Past Surgical History:  Procedure Laterality Date   NO PAST SURGERIES      The following portions of the patient's history were reviewed and updated as appropriate: allergies, current medications, past family history, past medical history, past social history, past surgical history and problem list.   Health Maintenance:   Diagnosis  Date Value Ref Range Status  04/03/2021   Final   - Negative for intraepithelial lesion or malignancy (NILM)   Review of Systems:  Pertinent items noted in HPI and remainder of comprehensive ROS otherwise negative.  Physical Exam:  BP 129/78   Pulse 65   Wt 117 lb (53.1 kg)   LMP 05/18/2022 (Exact Date)   BMI 19.47 kg/m  CONSTITUTIONAL: Well-developed, well-nourished female in no acute distress.  HEENT:  Normocephalic, atraumatic. External right and left ear normal. No scleral icterus.  NECK: Normal range of motion, supple, no masses noted on observation SKIN: No rash noted. Not diaphoretic. No erythema. No pallor. MUSCULOSKELETAL: Normal range of motion. No edema noted. NEUROLOGIC: Alert and oriented to person, place, and time. Normal muscle tone coordination. No cranial nerve deficit noted. PSYCHIATRIC: Normal mood and affect. Normal behavior. Normal judgment and thought content.  PELVIC:  Bimanual done with chaperone. No tampon in the vagina  Labs and Imaging No results found for this or any previous visit (from the past 168 hour(s)). No results found.  Assessment and Plan:   1. Worried well Pt reassured.   Routine preventative health maintenance measures emphasized. Please  refer to After Visit Summary for other counseling recommendations.   Return in about 1 year (around 05/27/2023) for annual.  Radene Gunning, MD, [place], [facility] for [employer], [facility] [place]

## 2022-06-12 ENCOUNTER — Encounter: Payer: Self-pay | Admitting: Student

## 2022-06-12 ENCOUNTER — Encounter: Payer: Self-pay | Admitting: Family Medicine

## 2022-11-16 IMAGING — US US MFM OB LIMITED
1 series · 14 of 28 positions shown · non-contrast
Comparison: none

[Series 1: us mfm ob limited · 33 acquisitions, 14 frames shown]
[im 2/33]
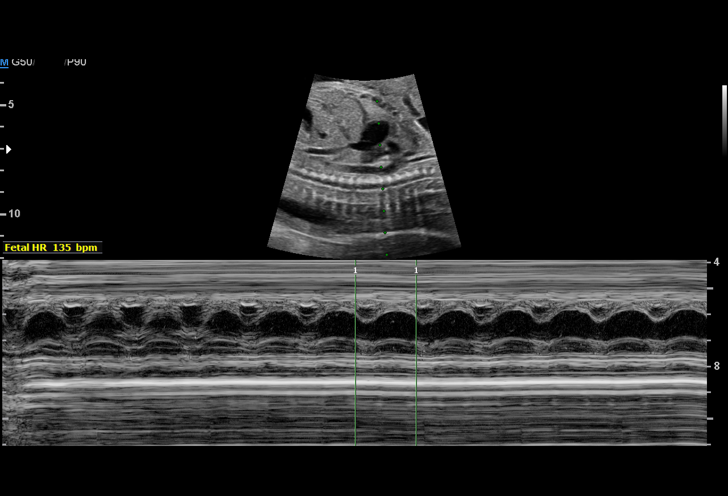
[im 4/33]
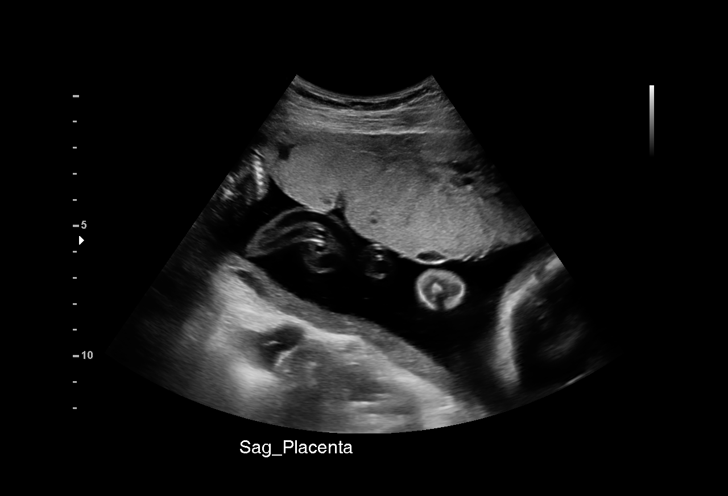
[im 6/33]
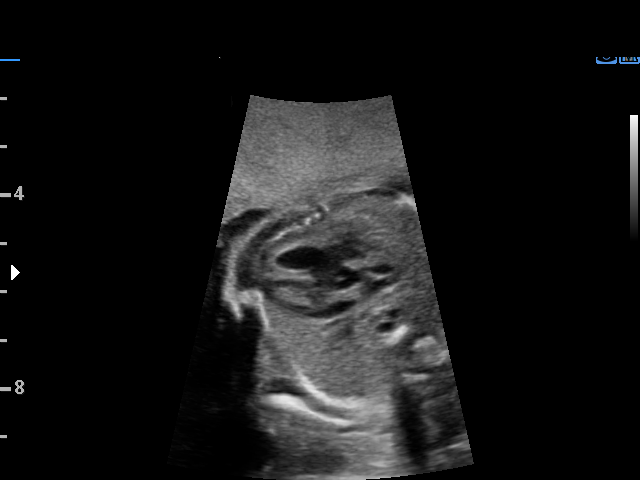
[im 9/33]
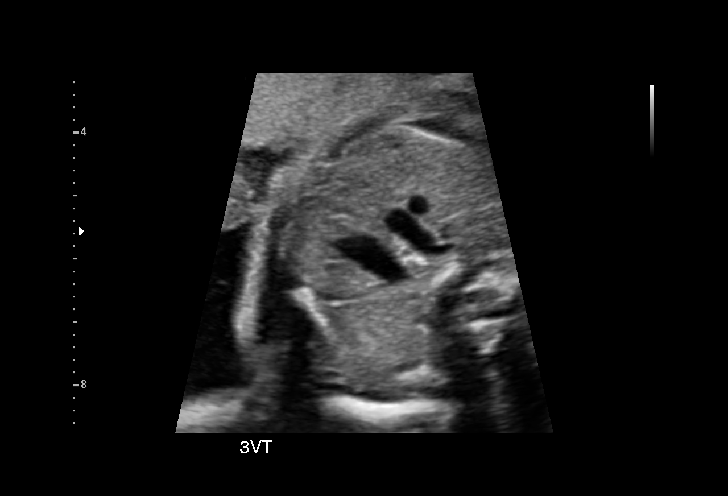
[im 11/33]
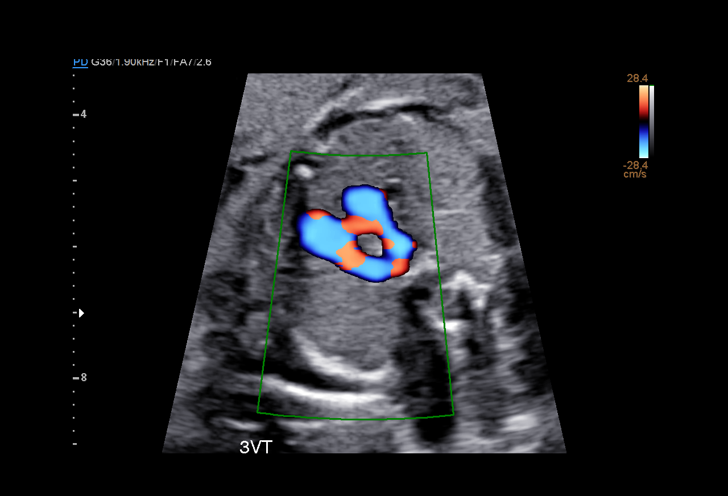
[im 14/33]
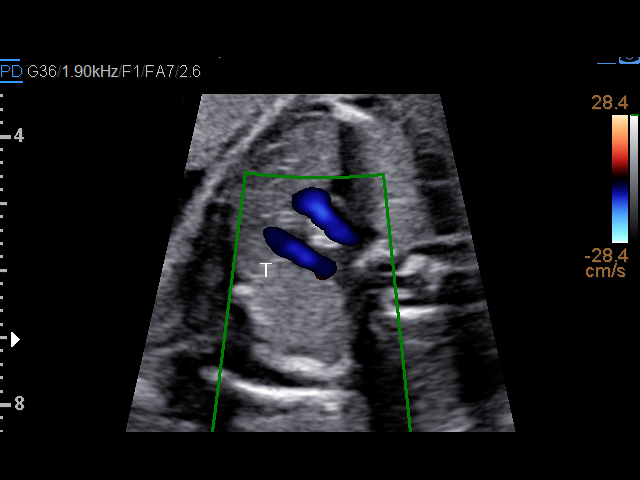
[im 16/33]
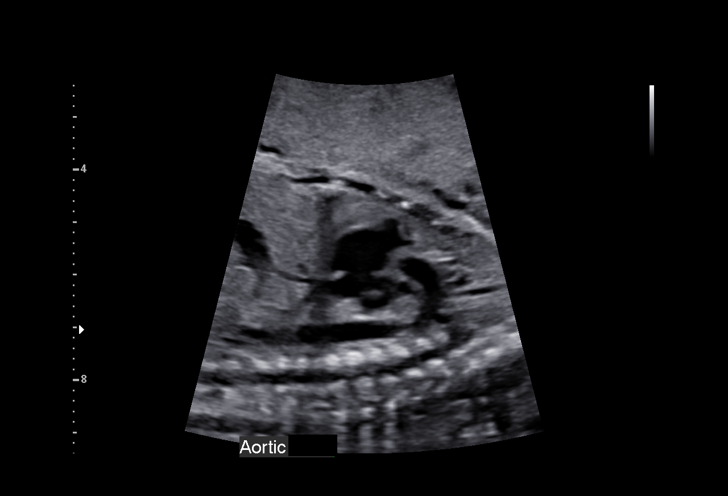
[im 18/33]
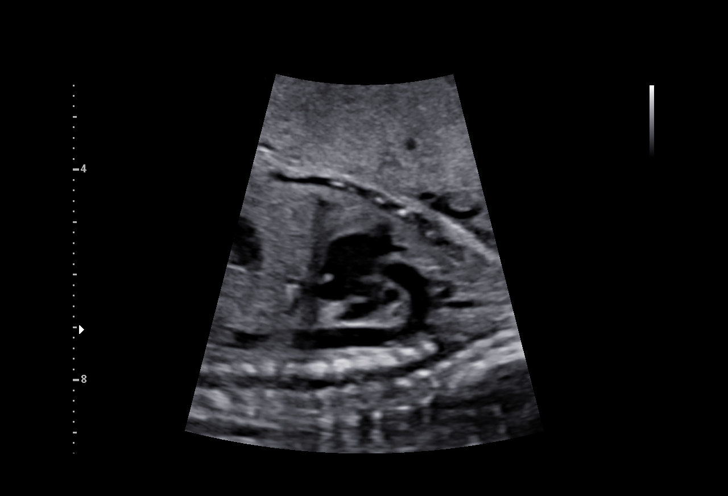
[im 21/33]
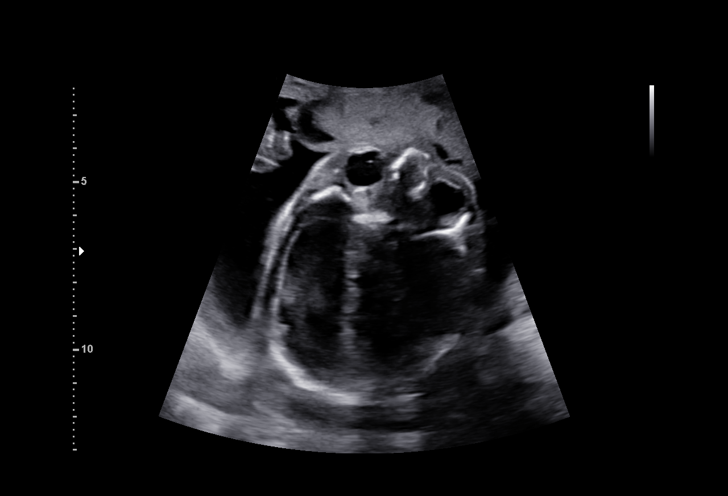
[im 23/33]
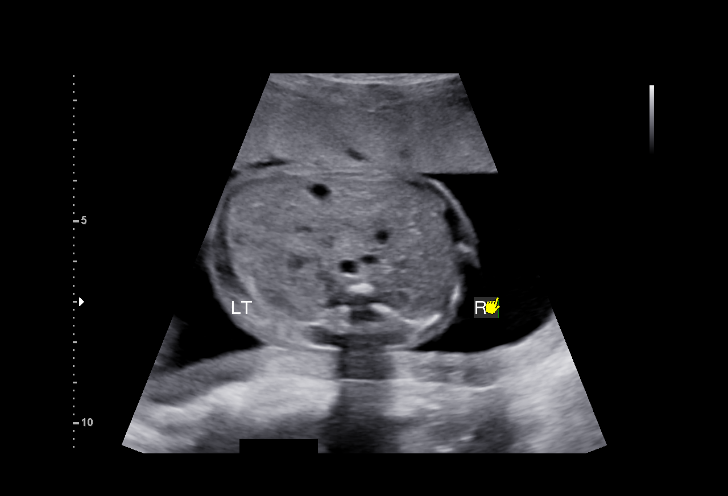
[im 25/33]
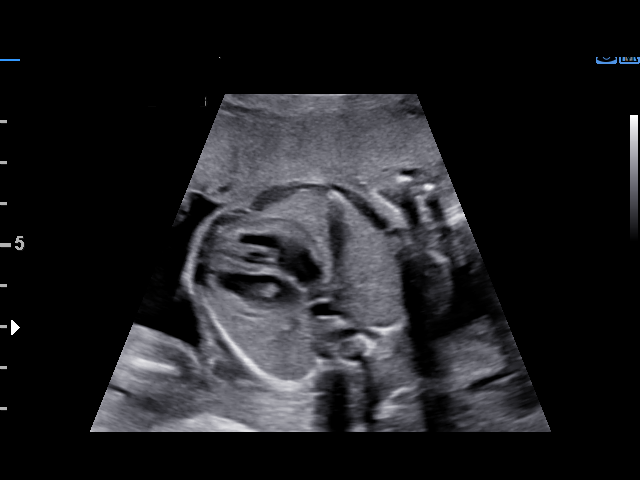
[im 28/33]
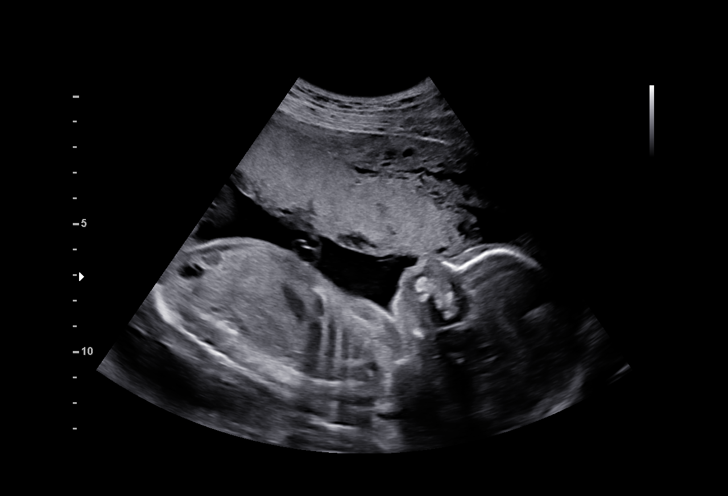
[im 30/33]
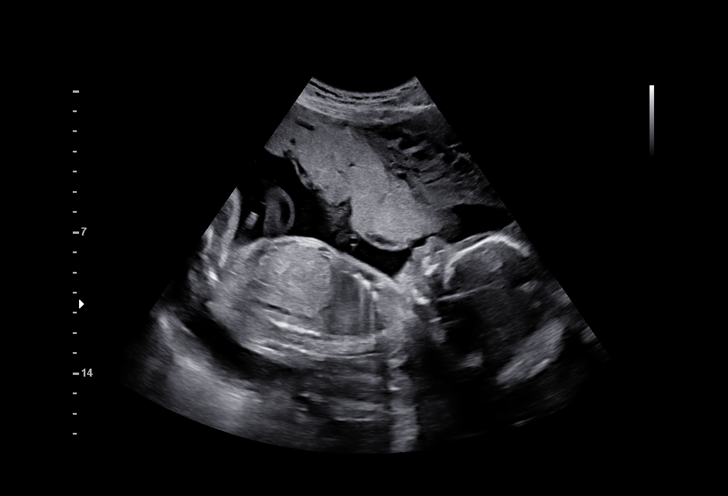
[im 33/33]
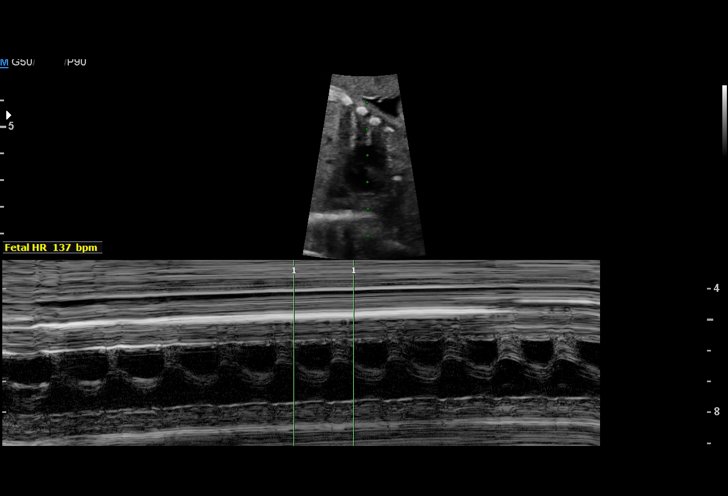

[14 of 28 positions shown; findings below may reference images not displayed]

1  US MFM AMNIOCENTESIS                  76946.01    WILLY RHODEN
 2  US MFM OB LIMITED                     76815.01    WILLY RHODEN

Indications

 Abnormal fetal ultrasound (right aortic arch)
 Abnormal finding on antenatal screening
 (NIPS - atypical findings on sex
 chromosomes)
 Echogenic intracardiac focus of the heart
 (EIF)
 Genetic carrier (Hgb E)
 24 weeks gestation of pregnancy
Fetal Evaluation

 Num Of Fetuses:         1
 Fetal Heart Rate(bpm):  135
 Cardiac Activity:       Observed
 Presentation:           Cephalic
 Placenta:               Anterior
 P. Cord Insertion:      Previously Visualized

 Amniotic Fluid
 AFI FV:      Within normal limits

                             Largest Pocket(cm)

OB History

 Gravidity:    2`        Term:   1        Prem:   0        SAB:   0
 TOP:          0       Ectopic:  0        Living: 1
Gestational Age

 LMP:           25w 0d        Date:  01/10/21                 EDD:   10/17/21
 Best:          24w 0d     Det. By:  Early Ultrasound         EDD:   10/24/21
                                     (04/03/21)
Anatomy

 Aortic Arch:           Abnormal, see          Kidneys:                Appear normal
                        comments
 Stomach:               Appears normal, left   Bladder:                Appears normal
                        sided

 Other:  3VV abnormal
Guided Procedures

 Type:   Amniocentesis
 FH Pre Procedure:       135 bpm
 FH Post Procedure:      137 bpm
 Rh Type:                O positive
 Rh Immunoglobulin:      Not required, Rh positive
 Discharge Instructions: Post-procedure instructions given
 Needle Insertions:      22 gauge x 1
 Vol. Withdrawn:         33 ml of clear amniotic fluid
 Catheter Passes:        1 pass
 Transplacental:         Yes
 Complications:  None
 Comment:        Informed consent was obtained. A "time-out" was
                 performed before the procedure. Patient tolerated the
                 procedure well. We gave her post-procedure instructions.
Cervix Uterus Adnexa

 Cervix
 Normal appearance by transabdominal scan.

 Right Ovary
 Visualized.

 Left Ovary
 Visualized.
Impression

 Patient is here for amniocentesis.  On previous ultrasound,
 right aortic arch was suspected.  On cell-free fetal DNA
 screening, she had atypical finding on sex chromosomes.

 A limited ultrasound study was performed.  Right aortic arch
 is confirmed.  The thoracic aorta is just overlying spine or
 slightly to the right of the spine.  Abdominal aorta is seen on
 the left of the spine.  Amniotic fluid is normal and good fetal
 activity seen.

 Right aortic arch is associated with 22 q. 11.2 deletions.
 Patient would like to know the fetal karyotype because of
 suspected cardiac anomaly and atypical finding on sex
 chromosomes on cell-free fetal DNA screening.
 I explained amniocentesis procedure and possible
 complication of preterm delivery/PPROM (1 and 500
 procedures).

 After informed consent, amniocentesis was performed by Dr.
 Nancy under ultrasound guidance and in the initial 3 mL of
 fluid was discarded.  Additional 30 milliliters of clear amniotic
 fluid was withdrawn. Fluid was sent for fetal karyotype and
 microarray analysis. Patient tolerated the procedure well.
 Post-procedure fetal heart rate was normal .  We gave her
 postprocedure instructions.
 Blood type O positive.
Recommendations

 - Patient has an appointment for fetal echocardiography next
 week (Erxleben).
 -Follow fetal growth assessment on 07/25/2021
                 Tesema, Swarna

## 2022-12-06 ENCOUNTER — Other Ambulatory Visit: Payer: Self-pay | Admitting: Family Medicine

## 2023-07-27 ENCOUNTER — Ambulatory Visit: Admitting: Obstetrics and Gynecology

## 2023-08-24 ENCOUNTER — Ambulatory Visit: Admitting: Obstetrics and Gynecology

## 2023-09-20 ENCOUNTER — Ambulatory Visit (HOSPITAL_BASED_OUTPATIENT_CLINIC_OR_DEPARTMENT_OTHER)
Admit: 2023-09-20 | Discharge: 2023-09-20 | Disposition: A | Discharge status: Home / Self Care | Attending: Family Medicine | Admitting: Family Medicine

## 2023-09-20 ENCOUNTER — Encounter (HOSPITAL_BASED_OUTPATIENT_CLINIC_OR_DEPARTMENT_OTHER): Payer: Self-pay | Admitting: Emergency Medicine

## 2023-09-20 ENCOUNTER — Ambulatory Visit (HOSPITAL_BASED_OUTPATIENT_CLINIC_OR_DEPARTMENT_OTHER)
Admission: EM | Admit: 2023-09-20 | Discharge: 2023-09-20 | Disposition: A | Discharge status: Home / Self Care | Attending: Family Medicine | Admitting: Family Medicine

## 2023-09-20 DIAGNOSIS — M25522 Pain in left elbow: Secondary | ICD-10-CM | POA: Diagnosis not present

## 2023-09-20 DIAGNOSIS — Z23 Encounter for immunization: Secondary | ICD-10-CM | POA: Diagnosis not present

## 2023-09-20 DIAGNOSIS — S51012A Laceration without foreign body of left elbow, initial encounter: Secondary | ICD-10-CM

## 2023-09-20 MED ORDER — MUPIROCIN 2 % EX OINT: 1.0000 | TOPICAL_OINTMENT | Freq: Two times a day (BID) | CUTANEOUS | 0 refills | Status: AC

## 2023-09-20 MED ORDER — TETANUS-DIPHTH-ACELL PERTUSSIS 5-2.5-18.5 LF-MCG/0.5 IM SUSY
0.5000 mL | PREFILLED_SYRINGE | Freq: Once | INTRAMUSCULAR | Status: AC
  Administered 2023-09-20: 0.5 mL via INTRAMUSCULAR

## 2023-09-20 NOTE — Discharge Instructions (Addendum)
 Tdap given today.  Left elbow laceration sutured.  Site was cleaned, mupirocin and bandage were applied.  Wound care is as follows: Clean daily with warm soapy fingers, rinse, pat dry, apply mupirocin ointment and a bandage.  Return in 7 to 10 days for suture removal.  Left elbow film was normal.  Provided a sling to give the left arm support.  Will update the patient if the radiology impression of her x-rays differs from my read of normal.  May use OTC ibuprofen , as needed for pain.  Follow-up if symptoms do not improve, worsen or new symptoms occur.
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
## 2023-10-04 ENCOUNTER — Ambulatory Visit (HOSPITAL_BASED_OUTPATIENT_CLINIC_OR_DEPARTMENT_OTHER): Admission: EM | Admit: 2023-10-04 | Discharge: 2023-10-04 | Disposition: A | Discharge status: Home / Self Care

## 2023-10-04 ENCOUNTER — Encounter (HOSPITAL_BASED_OUTPATIENT_CLINIC_OR_DEPARTMENT_OTHER): Payer: Self-pay | Admitting: Emergency Medicine

## 2023-10-04 DIAGNOSIS — S51012D Laceration without foreign body of left elbow, subsequent encounter: Secondary | ICD-10-CM

## 2023-10-04 DIAGNOSIS — Z4802 Encounter for removal of sutures: Secondary | ICD-10-CM

## 2023-10-04 NOTE — ED Provider Notes (Signed)
 [facility]    CSN: [id number] Arrival date & time: 10/04/23  1507      History   Chief Complaint No chief complaint on file.   HPI Christina Reeves is a 28 y.o. female.   Here for suture removal     History reviewed. No pertinent past medical history.  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 Authorizing Provider  mupirocin  ointment (BACTROBAN ) 2 % Apply 1 Application topically 2 (two) times daily. 09/20/23   Christina Legacy, FNP  omeprazole (PRILOSEC) 10 MG capsule Take 10 mg by mouth daily.    [provider]  Prenatal Vit-Fe Fumarate-FA (PREPLUS) 27-1 MG TABS Take 1 tablet by mouth daily. 03/01/19   Reeves, Christina J, DO    Family History Family History  Problem Relation Age of Onset   Hypertension Mother    Diabetes Brother    Cancer Paternal Grandmother        breast   Breast cancer Paternal Grandmother    Cancer Paternal Grandfather     Social History Social History   Tobacco Use   Smoking status: Never   Smokeless tobacco: Never  Vaping Use   Vaping status: Never Used  Substance Use Topics   Alcohol use: Not Currently    Comment: on weekends, none while preg   Drug use: Never     Allergies   Patient has no known allergies.   Review of Systems Review of Systems   Physical  Exam Triage Vital Signs ED Triage Vitals [10/04/23 1525]  Encounter Vitals Group     BP      Girls Systolic BP Percentile      Girls Diastolic BP Percentile      Boys Systolic BP Percentile      Boys Diastolic BP Percentile      Pulse      Resp      Temp      Temp src      SpO2      Weight      Height      Head Circumference      Peak Flow      Pain Score 0     Pain Loc      Pain Education      Exclude from Growth Chart    No data found.  Updated Vital Signs LMP 09/10/2023 (Approximate)   Visual Acuity Right Eye Distance:   Left Eye Distance:   Bilateral Distance:    Right Eye Near:   Left Eye Near:  Bilateral Near:     Physical Exam   UC Treatments / Results  Labs (all labs ordered are listed, but only abnormal results are displayed) Labs Reviewed - No data to display  EKG   Radiology No results found.  Procedures Procedures (including critical care time)  Medications Ordered in UC Medications - No data to display  Initial Impression / Assessment and Plan / UC Course  I have reviewed the triage vital signs and the nursing notes.  Pertinent labs & imaging results that were available during my care of the patient were reviewed by me and considered in my medical decision making (see chart for details).  Plan of Care:  Here for suture removal only.  No difficulty removing the four sutures.  Site is clear, no redness, no drainage. Final Clinical Impressions(s) / UC Diagnoses   Final diagnoses:  Laceration of left elbow, subsequent encounter   Discharge Instructions   None    ED Prescriptions   None    PDMP not reviewed this encounter.   Christina Legacy, FNP 10/04/23 1544

## 2023-10-04 NOTE — ED Triage Notes (Signed)
 Pt returned to have stitches removed from visit on 06/01. Area is clean dry and intact.

## 2023-10-13 ENCOUNTER — Other Ambulatory Visit: Payer: Self-pay

## 2023-10-13 ENCOUNTER — Telehealth: Payer: Self-pay

## 2023-10-13 MED ORDER — NORGESTIMATE-ETH ESTRADIOL 0.25-35 MG-MCG PO TABS
1.0000 | ORAL_TABLET | Freq: Every day | ORAL | 1 refills | Status: DC
Start: 2023-10-13 — End: 2023-11-11

## 2023-10-13 NOTE — Telephone Encounter (Signed)
 Patient left VM requesting refill on birth control.  Patient has not been seen since Feb 2024.  Cancelled and no showed appts in April and May.  Spoke with patient letting her know we would refill rx until she is seen on 11/19/2023.  Erminio DELENA Rumps

## 2023-11-11 ENCOUNTER — Ambulatory Visit (HOSPITAL_BASED_OUTPATIENT_CLINIC_OR_DEPARTMENT_OTHER): Admitting: Family Medicine

## 2023-11-11 ENCOUNTER — Encounter (HOSPITAL_BASED_OUTPATIENT_CLINIC_OR_DEPARTMENT_OTHER): Payer: Self-pay | Admitting: Family Medicine

## 2023-11-11 VITALS — BP 120/82 | HR 66 | Temp 98.3°F | Resp 16 | Ht 64.96 in | Wt 112.2 lb

## 2023-11-11 DIAGNOSIS — K219 Gastro-esophageal reflux disease without esophagitis: Secondary | ICD-10-CM | POA: Insufficient documentation

## 2023-11-11 DIAGNOSIS — Z30011 Encounter for initial prescription of contraceptive pills: Secondary | ICD-10-CM | POA: Diagnosis not present

## 2023-11-11 MED ORDER — NORGESTIMATE-ETH ESTRADIOL 0.25-35 MG-MCG PO TABS: 1.0000 | ORAL_TABLET | Freq: Every day | ORAL | 0 refills | Status: DC

## 2023-11-11 NOTE — Progress Notes (Signed)
 Established Patient Office Visit  Subjective   Patient ID: Christina Reeves, female    DOB: [birth year]  Age: 28 y.o. MRN: [id number]  Chief Complaint  Patient presents with   Establish Care    Establish care     F/u as above.  New to my practice.  Overall doing well with few concerns.  She admits her GERD control could be better and I gave her a handout on this today.    Past Medical History:  Diagnosis Date   Contraception management    f/by GYN   GERD (gastroesophageal reflux disease)     Outpatient Encounter Medications as of 11/11/2023  Medication Sig   acetaminophen  (TYLENOL ) 500 MG tablet Take 500 mg by mouth.   omeprazole (PRILOSEC) 10 MG capsule Take 10 mg by mouth daily.   [DISCONTINUED] norgestimate -ethinyl estradiol  (ORTHO-CYCLEN) 0.25-35 MG-MCG tablet Take 1 tablet by mouth daily.   mupirocin  ointment (BACTROBAN ) 2 % Apply 1 Application topically 2 (two) times daily. (Patient not taking: Reported on 11/11/2023)   norgestimate -ethinyl estradiol  (ORTHO-CYCLEN) 0.25-35 MG-MCG tablet Take 1 tablet by mouth daily.   Prenatal Vit-Fe Fumarate-FA (PREPLUS) 27-1 MG TABS Take 1 tablet by mouth daily. (Patient not taking: Reported on 11/11/2023)   No facility-administered encounter medications on file as of 11/11/2023.    Social History   Tobacco Use   Smoking status: Never   Smokeless tobacco: Never  Vaping Use   Vaping status: Never Used  Substance Use Topics   Alcohol use: Not Currently    Comment: on weekends, none while preg   Drug use: Never      Review of Systems  Constitutional:  Negative for diaphoresis, fever, malaise/fatigue and weight loss.  Respiratory:  Negative for cough, shortness of breath and wheezing.   Cardiovascular:  Negative for chest pain, palpitations, orthopnea, claudication, leg swelling and PND.      Objective:     BP 120/82 (BP Location: Right Arm, Patient Position: Standing, Cuff Size: Normal)   Pulse 66   Temp 98.3 F (36.8 C)  (Oral)   Resp 16   Ht 5' 4.96 (1.65 m)   Wt 112 lb 3.2 oz (50.9 kg)   LMP 10/31/2023 (Exact Date)   SpO2 98%   BMI 18.69 kg/m    Physical Exam Constitutional:      General: She is not in acute distress.    Appearance: Normal appearance.  HENT:     Head: Normocephalic.  Neck:     Vascular: No carotid bruit.  Cardiovascular:     Rate and Rhythm: Normal rate and regular rhythm.     Pulses: Normal pulses.     Heart sounds: Normal heart sounds.  Pulmonary:     Effort: Pulmonary effort is normal.     Breath sounds: Normal breath sounds.  Abdominal:     General: Bowel sounds are normal.     Palpations: Abdomen is soft.  Musculoskeletal:     Cervical back: Neck supple. No tenderness.     Right lower leg: No edema.     Left lower leg: No edema.  Neurological:     Mental Status: She is alert.      No results found for any visits on 11/11/23.    The ASCVD Risk score (Arnett DK, et al., 2019) failed to calculate for the following reasons:   The 2019 ASCVD risk score is only valid for ages 44 to 19    Assessment & Plan:  Gastroesophageal reflux disease, unspecified whether  esophagitis present Assessment & Plan: Commended on healthy habits.  Advised a good skin exam annually with the provider of her choice.  Advised a lab panel on occasion.  Happy to give a small refill of her OCP's until she sees her GYN back in the coming month.   Encounter for initial prescription of contraceptive pills -     Norgestimate -Eth Estradiol ; Take 1 tablet by mouth daily.  Dispense: 28 tablet; Refill: 0    Return if symptoms worsen or fail to improve.    REDDING PONCE NORLEEN FALCON., MD

## 2023-11-11 NOTE — Assessment & Plan Note (Signed)
 Commended on healthy habits.  Advised a good skin exam annually with the provider of her choice.  Advised a lab panel on occasion.  Happy to give a small refill of her OCP's until she sees her GYN back in the coming month.

## 2023-11-18 ENCOUNTER — Telehealth (HOSPITAL_BASED_OUTPATIENT_CLINIC_OR_DEPARTMENT_OTHER): Payer: Self-pay | Admitting: Family Medicine

## 2023-11-18 NOTE — Telephone Encounter (Unsigned)
 Copied from CRM [id number]. Topic: General - Other >> Nov 18, 2023  4:21 PM Gennette ORN wrote: Reason for CRM: Patient stated she missed call from clinic. Please contact patient as soon as possible. [phone] . Best time to call to call 1pm-2pm .It's okay to left voicemail. She stated her insurance runs out tomorrow.

## 2023-11-19 ENCOUNTER — Ambulatory Visit: Admitting: Family Medicine

## 2023-11-19 MED ORDER — OMEPRAZOLE 10 MG PO CPDR: 10.0000 mg | DELAYED_RELEASE_CAPSULE | Freq: Every day | ORAL | 3 refills | Status: AC

## 2023-11-19 NOTE — Telephone Encounter (Signed)
 No message in pt's chart stating that anyone tried reaching out to pt. Sent pt a mychart message to see if anyone had left her a VM with any information.

## 2023-11-19 NOTE — Addendum Note (Signed)
 Addended by: RONNETTE DAMIEN SQUIBB on: 11/19/2023 01:46 PM   Modules accepted: Orders

## 2023-12-07 ENCOUNTER — Other Ambulatory Visit (HOSPITAL_BASED_OUTPATIENT_CLINIC_OR_DEPARTMENT_OTHER): Payer: Self-pay | Admitting: Family Medicine

## 2023-12-07 DIAGNOSIS — Z30011 Encounter for initial prescription of contraceptive pills: Secondary | ICD-10-CM

## 2023-12-07 MED ORDER — NORGESTIMATE-ETH ESTRADIOL 0.25-35 MG-MCG PO TABS: 1.0000 | ORAL_TABLET | Freq: Every day | ORAL | 5 refills | Status: DC

## 2023-12-08 ENCOUNTER — Other Ambulatory Visit (HOSPITAL_BASED_OUTPATIENT_CLINIC_OR_DEPARTMENT_OTHER): Payer: Self-pay

## 2024-03-28 ENCOUNTER — Other Ambulatory Visit (HOSPITAL_BASED_OUTPATIENT_CLINIC_OR_DEPARTMENT_OTHER): Payer: Self-pay | Admitting: Family Medicine

## 2024-03-28 DIAGNOSIS — Z30011 Encounter for initial prescription of contraceptive pills: Secondary | ICD-10-CM

## 2024-03-31 ENCOUNTER — Telehealth (HOSPITAL_BASED_OUTPATIENT_CLINIC_OR_DEPARTMENT_OTHER): Payer: Self-pay

## 2024-03-31 NOTE — Telephone Encounter (Signed)
 Pt needs a follow up in January or February for 6 month follow up.
